# Patient Record
Sex: Female | Born: 1985 | Race: Black or African American | Hispanic: No | Marital: Married | Smoking: Never smoker
Health system: Southern US, Community
[De-identification: ages and names within clinical notes are randomized; demographics above are authoritative.]

## PROBLEM LIST (undated history)

## (undated) DIAGNOSIS — M419 Scoliosis, unspecified: Secondary | ICD-10-CM

## (undated) DIAGNOSIS — R51 Headache: Secondary | ICD-10-CM

## (undated) DIAGNOSIS — F329 Major depressive disorder, single episode, unspecified: Secondary | ICD-10-CM

## (undated) DIAGNOSIS — F32A Depression, unspecified: Secondary | ICD-10-CM

## (undated) DIAGNOSIS — R519 Headache, unspecified: Secondary | ICD-10-CM

## (undated) HISTORY — DX: Headache, unspecified: R51.9

## (undated) HISTORY — DX: Depression, unspecified: F32.A

## (undated) HISTORY — DX: Major depressive disorder, single episode, unspecified: F32.9

## (undated) HISTORY — DX: Headache: R51

## (undated) HISTORY — PX: CHOLECYSTECTOMY: SHX55

## (undated) HISTORY — PX: BACK SURGERY: SHX140

## (undated) HISTORY — DX: Scoliosis, unspecified: M41.9

## (undated) HISTORY — PX: TONSILLECTOMY: SUR1361

---

## 2018-01-09 ENCOUNTER — Ambulatory Visit: Payer: Self-pay | Admitting: Family Medicine

## 2018-01-09 VITALS — BP 126/82 | HR 92 | Temp 98.5°F | Resp 18 | Wt 292.6 lb

## 2018-01-09 DIAGNOSIS — H9203 Otalgia, bilateral: Secondary | ICD-10-CM

## 2018-01-09 NOTE — Progress Notes (Signed)
Stacey Orr is a 32 y.o. female who presents today with concerns of ear pain. She denies trauma or drainage. She reports pain is sharp when swallowing and intermittent.  Review of Systems  Constitutional: Negative for chills, fever and malaise/fatigue.  HENT: Positive for ear pain. Negative for congestion, ear discharge, sinus pain and sore throat.   Eyes: Negative.   Respiratory: Negative for cough, sputum production and shortness of breath.   Cardiovascular: Negative.  Negative for chest pain.  Gastrointestinal: Negative for abdominal pain, diarrhea, nausea and vomiting.  Genitourinary: Negative for dysuria, frequency, hematuria and urgency.  Musculoskeletal: Negative for myalgias.  Skin: Negative.   Neurological: Negative for headaches.  Endo/Heme/Allergies: Negative.   Psychiatric/Behavioral: Negative.     O: Vitals:   01/09/18 1703  BP: 126/82  Pulse: 92  Resp: 18  Temp: 98.5 F (36.9 C)  SpO2: 100%    Physical Exam  Constitutional: She is oriented to person, place, and time. Vital signs are normal. She appears well-developed and well-nourished. She is active.  Non-toxic appearance. She does not have a sickly appearance.  HENT:  Head: Normocephalic.  Right Ear: Hearing, tympanic membrane, external ear and ear canal normal.  Left Ear: Hearing, tympanic membrane, external ear and ear canal normal.  Nose: Rhinorrhea present.  Mouth/Throat: Uvula is midline and oropharynx is clear and moist.  Neck: Normal range of motion. Neck supple.  Cardiovascular: Normal rate, regular rhythm, normal heart sounds and normal pulses.  Pulmonary/Chest: Effort normal and breath sounds normal.  Abdominal: Soft. Bowel sounds are normal.  Musculoskeletal: Normal range of motion.  Lymphadenopathy:       Head (right side): No submental and no submandibular adenopathy present.       Head (left side): No submental and no submandibular adenopathy present.    She has no cervical adenopathy.   Neurological: She is alert and oriented to person, place, and time.  Psychiatric: She has a normal mood and affect.  Vitals reviewed.   A: 1. Otalgia of both ears    P: Exam findings, diagnosis etiology and medication use and indications reviewed with patient. Follow- Up and discharge instructions provided. No emergent/urgent issues found on exam.  Patient verbalized understanding of information provided and agrees with plan of care (POC), all questions answered.  1. Otalgia of both ears Discussed OTC use of Tylenol or Motrin for pain and f/u if symptoms unimproved.

## 2018-01-09 NOTE — Patient Instructions (Signed)

## 2018-01-12 ENCOUNTER — Telehealth: Payer: Self-pay

## 2018-01-12 NOTE — Telephone Encounter (Signed)
I left a message to the patient asking to call us back. 

## 2018-04-20 DIAGNOSIS — Z3201 Encounter for pregnancy test, result positive: Secondary | ICD-10-CM | POA: Diagnosis not present

## 2018-04-20 DIAGNOSIS — N912 Amenorrhea, unspecified: Secondary | ICD-10-CM | POA: Diagnosis not present

## 2018-05-07 DIAGNOSIS — Z113 Encounter for screening for infections with a predominantly sexual mode of transmission: Secondary | ICD-10-CM | POA: Diagnosis not present

## 2018-05-07 DIAGNOSIS — Z3A10 10 weeks gestation of pregnancy: Secondary | ICD-10-CM | POA: Diagnosis not present

## 2018-05-07 DIAGNOSIS — Z3A08 8 weeks gestation of pregnancy: Secondary | ICD-10-CM | POA: Diagnosis not present

## 2018-05-07 DIAGNOSIS — O26891 Other specified pregnancy related conditions, first trimester: Secondary | ICD-10-CM | POA: Diagnosis not present

## 2018-05-07 DIAGNOSIS — Z3689 Encounter for other specified antenatal screening: Secondary | ICD-10-CM | POA: Diagnosis not present

## 2018-05-07 DIAGNOSIS — Z3481 Encounter for supervision of other normal pregnancy, first trimester: Secondary | ICD-10-CM | POA: Diagnosis not present

## 2018-05-07 LAB — OB RESULTS CONSOLE RPR: RPR: NONREACTIVE

## 2018-05-07 LAB — OB RESULTS CONSOLE ABO/RH: RH Type: POSITIVE

## 2018-05-07 LAB — OB RESULTS CONSOLE RUBELLA ANTIBODY, IGM: Rubella: IMMUNE

## 2018-05-07 LAB — OB RESULTS CONSOLE HEPATITIS B SURFACE ANTIGEN: Hepatitis B Surface Ag: NEGATIVE

## 2018-05-07 LAB — OB RESULTS CONSOLE HIV ANTIBODY (ROUTINE TESTING): HIV: NONREACTIVE

## 2018-05-07 LAB — OB RESULTS CONSOLE GC/CHLAMYDIA
Chlamydia: NEGATIVE
GC PROBE AMP, GENITAL: NEGATIVE

## 2018-05-07 LAB — OB RESULTS CONSOLE ANTIBODY SCREEN: Antibody Screen: NEGATIVE

## 2018-06-03 DIAGNOSIS — O3680X0 Pregnancy with inconclusive fetal viability, not applicable or unspecified: Secondary | ICD-10-CM | POA: Diagnosis not present

## 2018-06-03 DIAGNOSIS — Z3481 Encounter for supervision of other normal pregnancy, first trimester: Secondary | ICD-10-CM | POA: Diagnosis not present

## 2018-06-03 DIAGNOSIS — Z3A12 12 weeks gestation of pregnancy: Secondary | ICD-10-CM | POA: Diagnosis not present

## 2018-07-14 MED FILL — BUTALBITAL-APAP-CAFFEINE 50: 50-300-40 | 2 days supply | Qty: 10 | Fill #0

## 2018-07-16 DIAGNOSIS — Z363 Encounter for antenatal screening for malformations: Secondary | ICD-10-CM | POA: Diagnosis not present

## 2018-07-16 DIAGNOSIS — Z3A18 18 weeks gestation of pregnancy: Secondary | ICD-10-CM | POA: Diagnosis not present

## 2018-09-09 NOTE — L&D Delivery Note (Signed)
Delivery Note Patients labor was too intense to receive an epidural.  Progressed rapidly to C/C/+2-3 and pushed for delivery,  At 2:34 AM a viable and healthy female was delivered via Vaginal, Spontaneous (Presentation:OA; LOT ).  APGAR: 8, 9; weight P .   Placenta status: delivered, intact .  Cord:3V  with the following complications: none.    Anesthesia:  none Episiotomy: None Lacerations: R labial; multiple abrasions Suture Repair: 3.0 vicryl rapide Est. Blood Loss (mL): 105cc  Mom to postpartum.  Baby to Couplet care / Skin to Skin.  Stacey Orr 12/10/2018, 3:01 AM  Br/A+/RI/Contra?/Tdap in [facility]

## 2018-09-18 DIAGNOSIS — O99213 Obesity complicating pregnancy, third trimester: Secondary | ICD-10-CM | POA: Diagnosis not present

## 2018-09-18 DIAGNOSIS — Z3689 Encounter for other specified antenatal screening: Secondary | ICD-10-CM | POA: Diagnosis not present

## 2018-09-18 DIAGNOSIS — Z23 Encounter for immunization: Secondary | ICD-10-CM | POA: Diagnosis not present

## 2018-09-18 DIAGNOSIS — Z3A28 28 weeks gestation of pregnancy: Secondary | ICD-10-CM | POA: Diagnosis not present

## 2018-10-30 DIAGNOSIS — Z3483 Encounter for supervision of other normal pregnancy, third trimester: Secondary | ICD-10-CM | POA: Diagnosis not present

## 2018-10-30 DIAGNOSIS — Z113 Encounter for screening for infections with a predominantly sexual mode of transmission: Secondary | ICD-10-CM | POA: Diagnosis not present

## 2018-10-30 DIAGNOSIS — Z3A34 34 weeks gestation of pregnancy: Secondary | ICD-10-CM | POA: Diagnosis not present

## 2018-11-16 DIAGNOSIS — Z3689 Encounter for other specified antenatal screening: Secondary | ICD-10-CM | POA: Diagnosis not present

## 2018-11-16 DIAGNOSIS — Z3A36 36 weeks gestation of pregnancy: Secondary | ICD-10-CM | POA: Diagnosis not present

## 2018-11-16 DIAGNOSIS — Z3483 Encounter for supervision of other normal pregnancy, third trimester: Secondary | ICD-10-CM | POA: Diagnosis not present

## 2018-11-16 DIAGNOSIS — Z3685 Encounter for antenatal screening for Streptococcus B: Secondary | ICD-10-CM | POA: Diagnosis not present

## 2018-11-16 LAB — OB RESULTS CONSOLE GBS: STREP GROUP B AG: NEGATIVE

## 2018-12-07 ENCOUNTER — Telehealth (HOSPITAL_COMMUNITY): Payer: Self-pay | Admitting: *Deleted

## 2018-12-07 ENCOUNTER — Encounter (HOSPITAL_COMMUNITY): Payer: Self-pay | Admitting: *Deleted

## 2018-12-07 NOTE — Telephone Encounter (Signed)
Preadmission screen  

## 2018-12-09 ENCOUNTER — Other Ambulatory Visit: Payer: Self-pay

## 2018-12-09 ENCOUNTER — Encounter (HOSPITAL_COMMUNITY): Payer: Self-pay | Admitting: *Deleted

## 2018-12-09 ENCOUNTER — Inpatient Hospital Stay (EMERGENCY_DEPARTMENT_HOSPITAL)
Admission: AD | Admit: 2018-12-09 | Discharge: 2018-12-09 | Disposition: A | Discharge status: Home / Self Care | Payer: 59 | Source: Home / Self Care | Attending: Obstetrics and Gynecology | Admitting: Obstetrics and Gynecology

## 2018-12-09 DIAGNOSIS — O471 False labor at or after 37 completed weeks of gestation: Secondary | ICD-10-CM

## 2018-12-09 DIAGNOSIS — Z3A39 39 weeks gestation of pregnancy: Secondary | ICD-10-CM

## 2018-12-09 DIAGNOSIS — O479 False labor, unspecified: Secondary | ICD-10-CM

## 2018-12-09 NOTE — Progress Notes (Signed)
Notified Darrol Poke CNM- cervical exam- unchanged, fhm strip reviewed, awaiting d/c orders.

## 2018-12-09 NOTE — Discharge Instructions (Signed)
Reasons to return to MAU:  1.  Contractions are 4-5 minutes apart or less, each last 1 minute, these have been going on for 1-2 hours, and you cannot walk or talk during them 2.  You have a large gush of fluid, or a trickle of fluid that will not stop and you have to wear a pad 3.  You have bleeding that is bright red, heavier than spotting--like menstrual bleeding (spotting can be normal in early labor or after a check of your cervix) 4.  You do not feel the baby moving like he/she normally does

## 2018-12-09 NOTE — MAU Provider Note (Signed)
S: Ms. Stacey Orr is a 33 y.o. [phone] at [redacted]w[redacted]d  who presents to MAU today for labor evaluation.     Cervical exam by RN: no cervical change  Dilation: 2 Effacement (%): 50 Cervical Position: Posterior Station: -2 Presentation: Undeterminable Exam by:: Alycia Rossetti RN  Fetal Monitoring: Baseline: 140 Variability: moderate Accelerations: present  Decelerations: none  Contractions: UI with occasional mild contractions   MDM Discussed patient with RN. NST reviewed.   A: SIUP at [redacted]w[redacted]d  False labor  P: Discharge home Labor precautions and kick counts included in AVS Patient to follow-up with [facility] as scheduled  Patient may return to MAU as needed or when in labor  Follow up on 12/12/18 for scheduled induction of no SOL prior   Herby Abraham 12/09/2018 9:38 AM

## 2018-12-09 NOTE — MAU Note (Signed)
Contractions woke her around 4 this morning. No bleeding or leaking.  Denies problems with pregnancy. Has been 2 cm.

## 2018-12-10 ENCOUNTER — Inpatient Hospital Stay (HOSPITAL_COMMUNITY)
Admission: AD | Admit: 2018-12-10 | Discharge: 2018-12-11 | DRG: 807 | Disposition: A | Discharge status: Home / Self Care | Payer: 59 | Attending: Obstetrics and Gynecology | Admitting: Obstetrics and Gynecology

## 2018-12-10 ENCOUNTER — Encounter (HOSPITAL_COMMUNITY): Payer: Self-pay | Admitting: *Deleted

## 2018-12-10 ENCOUNTER — Encounter (HOSPITAL_COMMUNITY): Payer: Self-pay | Admitting: Anesthesiology

## 2018-12-10 ENCOUNTER — Other Ambulatory Visit: Payer: Self-pay

## 2018-12-10 DIAGNOSIS — O99214 Obesity complicating childbirth: Principal | ICD-10-CM | POA: Diagnosis present

## 2018-12-10 DIAGNOSIS — M419 Scoliosis, unspecified: Secondary | ICD-10-CM | POA: Diagnosis present

## 2018-12-10 DIAGNOSIS — Z3A39 39 weeks gestation of pregnancy: Secondary | ICD-10-CM

## 2018-12-10 DIAGNOSIS — O26893 Other specified pregnancy related conditions, third trimester: Secondary | ICD-10-CM | POA: Diagnosis present

## 2018-12-10 LAB — CBC
HCT: 39.7 % (ref 36.0–46.0)
HCT: 35.6 % — ABNORMAL LOW (ref 36.0–46.0)
Hemoglobin: 12.6 g/dL (ref 12.0–15.0)
Hemoglobin: 11 g/dL — ABNORMAL LOW (ref 12.0–15.0)
MCH: 25 pg — ABNORMAL LOW (ref 26.0–34.0)
MCH: 24.3 pg — ABNORMAL LOW (ref 26.0–34.0)
MCHC: 31.7 g/dL (ref 30.0–36.0)
MCHC: 30.9 g/dL (ref 30.0–36.0)
MCV: 78.9 fL — ABNORMAL LOW (ref 80.0–100.0)
MCV: 78.8 fL — ABNORMAL LOW (ref 80.0–100.0)
Platelets: 218 10*3/uL (ref 150–400)
Platelets: 211 10*3/uL (ref 150–400)
RBC: 5.03 MIL/uL (ref 3.87–5.11)
RBC: 4.52 MIL/uL (ref 3.87–5.11)
RDW: 14.8 % (ref 11.5–15.5)
RDW: 14.7 % (ref 11.5–15.5)
WBC: 7.6 10*3/uL (ref 4.0–10.5)
WBC: 14.7 10*3/uL — ABNORMAL HIGH (ref 4.0–10.5)
nRBC: 0 % (ref 0.0–0.2)
nRBC: 0 % (ref 0.0–0.2)

## 2018-12-10 LAB — TYPE AND SCREEN
ABO/RH(D): A POS
Antibody Screen: NEGATIVE

## 2018-12-10 LAB — ABO/RH: ABO/RH(D): A POS

## 2018-12-10 MED ORDER — LACTATED RINGERS IV SOLN: 500.0000 mL | Freq: Once | INTRAVENOUS | Status: DC

## 2018-12-10 MED ORDER — DIPHENHYDRAMINE HCL 25 MG PO CAPS: 25.0000 mg | ORAL_CAPSULE | Freq: Four times a day (QID) | ORAL | Status: DC | PRN

## 2018-12-10 MED ORDER — FENTANYL-BUPIVACAINE-NACL 0.5-0.125-0.9 MG/250ML-% EP SOLN: 12.0000 mL/h | EPIDURAL | Status: DC | PRN

## 2018-12-10 MED ORDER — OXYCODONE-ACETAMINOPHEN 5-325 MG PO TABS: 1.0000 | ORAL_TABLET | ORAL | Status: DC | PRN

## 2018-12-10 MED ORDER — ONDANSETRON HCL 4 MG/2ML IJ SOLN: 4.0000 mg | INTRAMUSCULAR | Status: DC | PRN

## 2018-12-10 MED ORDER — ACETAMINOPHEN 325 MG PO TABS: 650.0000 mg | ORAL_TABLET | ORAL | Status: DC | PRN

## 2018-12-10 MED ORDER — SOD CITRATE-CITRIC ACID 500-334 MG/5ML PO SOLN: 30.0000 mL | ORAL | Status: DC | PRN

## 2018-12-10 MED ORDER — EPHEDRINE 5 MG/ML INJ: 10.0000 mg | INTRAVENOUS | Status: DC | PRN

## 2018-12-10 MED ORDER — SENNOSIDES-DOCUSATE SODIUM 8.6-50 MG PO TABS
2.0000 | ORAL_TABLET | ORAL | Status: DC
  Administered 2018-12-10: 23:00:00 2 via ORAL
  Filled 2018-12-10: qty 2

## 2018-12-10 MED ORDER — WITCH HAZEL-GLYCERIN EX PADS: 1.0000 "application " | MEDICATED_PAD | CUTANEOUS | Status: DC | PRN

## 2018-12-10 MED ORDER — ZOLPIDEM TARTRATE 5 MG PO TABS: 5.0000 mg | ORAL_TABLET | Freq: Every evening | ORAL | Status: DC | PRN

## 2018-12-10 MED ORDER — DIPHENHYDRAMINE HCL 50 MG/ML IJ SOLN: 12.5000 mg | INTRAMUSCULAR | Status: DC | PRN

## 2018-12-10 MED ORDER — PRENATAL MULTIVITAMIN CH
1.0000 | ORAL_TABLET | Freq: Every day | ORAL | Status: DC
  Administered 2018-12-10: 1 via ORAL
  Filled 2018-12-10: qty 1

## 2018-12-10 MED ORDER — FLEET ENEMA 7-19 GM/118ML RE ENEM: 1.0000 | ENEMA | RECTAL | Status: DC | PRN

## 2018-12-10 MED ORDER — OXYCODONE HCL 5 MG PO TABS: 5.0000 mg | ORAL_TABLET | ORAL | Status: DC | PRN

## 2018-12-10 MED ORDER — ONDANSETRON HCL 4 MG/2ML IJ SOLN: 4.0000 mg | Freq: Four times a day (QID) | INTRAMUSCULAR | Status: DC | PRN

## 2018-12-10 MED ORDER — FENTANYL-BUPIVACAINE-NACL 0.5-0.125-0.9 MG/250ML-% EP SOLN
12.0000 mL/h | EPIDURAL | Status: DC | PRN
  Filled 2018-12-10: qty 250

## 2018-12-10 MED ORDER — BENZOCAINE-MENTHOL 20-0.5 % EX AERO
1.0000 "application " | INHALATION_SPRAY | CUTANEOUS | Status: DC | PRN
  Filled 2018-12-10: qty 56

## 2018-12-10 MED ORDER — LIDOCAINE HCL (PF) 1 % IJ SOLN
30.0000 mL | INTRAMUSCULAR | Status: AC | PRN
  Administered 2018-12-10: 30 mL via SUBCUTANEOUS
  Filled 2018-12-10: qty 30

## 2018-12-10 MED ORDER — COCONUT OIL OIL: 1.0000 "application " | TOPICAL_OIL | Status: DC | PRN

## 2018-12-10 MED ORDER — LACTATED RINGERS IV SOLN: INTRAVENOUS | Status: DC

## 2018-12-10 MED ORDER — LACTATED RINGERS IV SOLN: 500.0000 mL | INTRAVENOUS | Status: DC | PRN

## 2018-12-10 MED ORDER — OXYTOCIN 40 UNITS IN NORMAL SALINE INFUSION - SIMPLE MED: 2.5000 [IU]/h | INTRAVENOUS | Status: DC

## 2018-12-10 MED ORDER — OXYTOCIN BOLUS FROM INFUSION: 500.0000 mL | Freq: Once | INTRAVENOUS | Status: DC

## 2018-12-10 MED ORDER — PHENYLEPHRINE 40 MCG/ML (10ML) SYRINGE FOR IV PUSH (FOR BLOOD PRESSURE SUPPORT): 80.0000 ug | PREFILLED_SYRINGE | INTRAVENOUS | Status: DC | PRN

## 2018-12-10 MED ORDER — DIBUCAINE 1 % RE OINT: 1.0000 "application " | TOPICAL_OINTMENT | RECTAL | Status: DC | PRN

## 2018-12-10 MED ORDER — LACTATED RINGERS IV SOLN
INTRAVENOUS | Status: DC
  Administered 2018-12-10: 02:00:00 via INTRAVENOUS

## 2018-12-10 MED ORDER — TETANUS-DIPHTH-ACELL PERTUSSIS 5-2.5-18.5 LF-MCG/0.5 IM SUSP: 0.5000 mL | Freq: Once | INTRAMUSCULAR | Status: DC

## 2018-12-10 MED ORDER — ONDANSETRON HCL 4 MG PO TABS: 4.0000 mg | ORAL_TABLET | ORAL | Status: DC | PRN

## 2018-12-10 MED ORDER — IBUPROFEN 600 MG PO TABS
600.0000 mg | ORAL_TABLET | Freq: Four times a day (QID) | ORAL | Status: DC
  Administered 2018-12-10 – 2018-12-11 (×5): 600 mg via ORAL
  Filled 2018-12-10 (×6): qty 1

## 2018-12-10 MED ORDER — OXYCODONE HCL 5 MG PO TABS: 10.0000 mg | ORAL_TABLET | ORAL | Status: DC | PRN

## 2018-12-10 MED ORDER — OXYCODONE-ACETAMINOPHEN 5-325 MG PO TABS: 2.0000 | ORAL_TABLET | ORAL | Status: DC | PRN

## 2018-12-10 MED ORDER — SIMETHICONE 80 MG PO CHEW: 80.0000 mg | CHEWABLE_TABLET | ORAL | Status: DC | PRN

## 2018-12-10 NOTE — Progress Notes (Signed)
Patient ID: Stacey Orr, female   DOB: 04/25/1986, 33 y.o.   MRN: [id number] PPD#0 Pt doing well with no complaints. Pain well controlled with ibuprofen. No fever/CP/SOB/HA. She is bonding well with baby. Lochia mild. Ambulating to bathroom; voiding well. Bonding well with baby VSS ABD - FF  EXT - no homans  A/P: PPD#0 s/p svd - stable         Routine pp care         Circ tomorrow

## 2018-12-10 NOTE — Lactation Note (Signed)
This note was copied from a baby's chart. Lactation Consultation Note  Patient Name: Stacey Orr MCNOB'S Date: 12/10/2018 Reason for consult: Initial assessment;Term  30 hours old FT female who is being exclusively BF by his mother, she's a P2 but not very experienced BF. She was able to BF her first child only for 2 weeks; baby had a difficult latching on. Mom participated in the [facility] program at [facility] and took BF classes there. She's familiar with hand expression, and has been able to see drops of colostrum.   Offered assistance with latch, but mom politely declined stating baby already fed. Asked mom to call for assistance when needed. LC explained to mom that baby's weight most likely will drop < 6 lbs tomorrow and asked her how does she feel about pumping. Mom voiced she's willing to pump, LC set up a DEBP instructions, cleaning and storage were reviewed. Mom doesn't have a pump at home, [facility] also showed mom how to convert her DEBP kit into a hand pump. Discussed normal newborn behavior, feeding cues and cluster feeding.  Feeding plan  1. Encouraged mom to feed baby every 3 hours or sooner if feeding cues are present 2. Mom will pump every 3 hours and feed baby any amount of EBM she may get  BF brochure and feeding diary were reviewed. Mom reported all questions and concerns were answered, she's aware of [place] [place] services and will call PRN.  Maternal Data Formula Feeding for Exclusion: No Has patient been taught Hand Expression?: Yes Does the patient have breastfeeding experience prior to this delivery?: Yes  Feeding Feeding Type: Breast Fed   Interventions Interventions: Breast feeding basics reviewed;DEBP  Lactation Tools Discussed/Used Tools: Pump Breast pump type: Double-Electric Breast Pump WIC Program: Yes Pump Review: Setup, frequency, and cleaning Initiated by:: MPeck Date initiated:: 12/10/18   Consult Status Consult Status: Follow-up Date: 12/11/18 Follow-up  type: In-patient    Sena Clouatre Francene Boyers 12/10/2018, 4:48 PM

## 2018-12-10 NOTE — H&P (Signed)
Stacey Orr is a 33 y.o. female [phone] at 39+ presentin gin advanced labor and rapidly delivered, see delivery note for details.  Pregnancy complicated by maternal obesity, maternal scoliosis w harrington rods having been placed and migraines.  Pregnancy dated by early [place]. Pt declined genetic screening.  Received Tdap in [facility].     OB History    Gravida  4   Para  1   Term  1   Preterm      AB  2   Living  1     SAB  2   TAB      Ectopic      Multiple      Live Births  1         SAB x 2 Female, SVD, 6#4 G4 present  No abn pap No STD  Past Medical History:  Diagnosis Date  . Depression    mild  . Headache   . Scoliosis   . SVD (spontaneous vaginal delivery) 12/10/2018  obesity  Past Surgical History:  Procedure Laterality Date  . BACK SURGERY    . CHOLECYSTECTOMY    . TONSILLECTOMY     Family History: ovarian cancer, DM, HTN, MI, CHF Social History:  reports that she has never smoked. She has never used smokeless tobacco. She reports that she does not drink alcohol or use drugs.  Meds PNV All NKDA, pecan allergy     Maternal Diabetes: No Genetic Screening: Declined Maternal Ultrasounds/Referrals: Normal Fetal Ultrasounds or other Referrals:  None Maternal Substance Abuse:  No Significant Maternal Medications:  None Significant Maternal Lab Results:  Lab values include: Group B Strep negative Other Comments:  None  Review of Systems  Constitutional: Negative.   HENT: Negative.   Eyes: Negative.   Respiratory: Negative.   Cardiovascular: Negative.   Gastrointestinal: Negative.   Genitourinary: Negative.   Musculoskeletal: Negative.   Skin: Negative.   Neurological: Negative.   Psychiatric/Behavioral: Negative.    Maternal Medical History:  Reason for admission: Contractions.   Contractions: Onset was 13-24 hours ago.   Frequency: regular.   Perceived severity is strong.    Fetal activity: Perceived fetal activity is normal.     Prenatal Complications - Diabetes: none.    Dilation: 10 Effacement (%): 90 Station: Plus 1 Exam by:: Bovard  Blood pressure 118/69, pulse 95, resp. rate 18, height 5\' 4"  (1.626 m), weight 133.3 kg, last menstrual period 02/26/2018, SpO2 98 %. Maternal Exam:  Uterine Assessment: Contraction frequency is regular.   Abdomen: Fundal height is difficult to appreciate secondary to obesity.   Estimated fetal weight is 6-7#.   Fetal presentation: vertex  Introitus: Normal vulva. Normal vagina.  Cervix: Cervix evaluated by digital exam.     Physical Exam  Constitutional: She is oriented to person, place, and time. She appears well-developed and well-nourished.  MORBID OBESITY  HENT:  Head: Normocephalic and atraumatic.  Cardiovascular: Normal rate and regular rhythm.  Respiratory: Effort normal and breath sounds normal. No respiratory distress. She has no wheezes.  GI: Soft. Bowel sounds are normal. She exhibits no distension. There is no abdominal tenderness.  Genitourinary:    Vulva normal.   Musculoskeletal: Normal range of motion.  Neurological: She is alert and oriented to person, place, and time.  Skin: Skin is warm and dry.  Psychiatric: She has a normal mood and affect. Her behavior is normal.    Prenatal labs: ABO, Rh: --/--/A POS (04/02 0109) Antibody: NEG (04/02 0109) Rubella: Immune (  08/29 0000) RPR: Nonreactive (08/29 0000)  HBsAg: Negative (08/29 0000)  HIV: Non-reactive (08/29 0000)  GBS: Negative (03/09 0000)   Tdap 1/10 Hgb 11.8/Plt 283/Ur Cx neg/Chl neg/GC neg/Varicella immune/Hgb electro WNL/glucola 113  [place] nl anat, post plac, female  Assessment/Plan: 33yo [id number] to [id number] s/p SVD. Routine PP care.     Mikayla Chiusano Bovard-Stuckert 12/10/2018, 3:21 AM

## 2018-12-10 NOTE — Anesthesia Preprocedure Evaluation (Deleted)
Anesthesia Evaluation  Patient identified by MRN, date of birth, ID band Patient awake    Reviewed: Allergy & Precautions, H&P , NPO status , Patient's Chart, lab work & pertinent test results  Airway Mallampati: II   Neck ROM: full    Dental   Pulmonary neg pulmonary ROS,    breath sounds clear to auscultation       Cardiovascular negative cardio ROS   Rhythm:regular Rate:Normal     Neuro/Psych  Headaches, PSYCHIATRIC DISORDERS Depression    GI/Hepatic   Endo/Other  Morbid obesity  Renal/GU      Musculoskeletal H/o scoliosis and has had back surgery   Abdominal   Peds  Hematology   Anesthesia Other Findings   Reproductive/Obstetrics (+) Pregnancy                             Anesthesia Physical Anesthesia Plan  ASA: II  Anesthesia Plan: Epidural   Post-op Pain Management:    Induction: Intravenous  PONV Risk Score and Plan: 2 and Treatment may vary due to age or medical condition  Airway Management Planned: Natural Airway  Additional Equipment:   Intra-op Plan:   Post-operative Plan:   Informed Consent: I have reviewed the patients History and Physical, chart, labs and discussed the procedure including the risks, benefits and alternatives for the proposed anesthesia with the patient or authorized representative who has indicated his/her understanding and acceptance.       Plan Discussed with: Anesthesiologist  Anesthesia Plan Comments:         Anesthesia Quick Evaluation

## 2018-12-11 LAB — RPR, QUANT+TP ABS (REFLEX)
Rapid Plasma Reagin, Quant: 1:1 {titer} — ABNORMAL HIGH
T Pallidum Abs: NONREACTIVE

## 2018-12-11 LAB — RPR: RPR Ser Ql: REACTIVE — AB

## 2018-12-11 MED ORDER — IBUPROFEN 600 MG PO TABS: 600.0000 mg | ORAL_TABLET | Freq: Four times a day (QID) | ORAL | 0 refills | Status: DC

## 2018-12-11 MED FILL — IBUPROFEN 600 MG TABLET: 600 | 8 days supply | Qty: 30 | Fill #0

## 2018-12-11 NOTE — Progress Notes (Signed)
CSW received consult for hx of Anxiety and Depression.  CSW met with MOB in room 409 to offer support and complete assessment.    When CSW arrived, MOB was resting in bed and FOB was holding/bonding with infant.  CSW explained CSW's role and MOB gave CSW permission to complete the assessment while FOB was present.   CSW asked about MOB's MH hx and MOB and FOB collectively denied that MOB has a hx of depression.  MOB also denied PPD with MOB's oldest child. MOB declined PMAD resources and reported having all essential items to care for infant.    CSW identifies no further need for intervention and no barriers to discharge at this time.  Angel Boyd-Gilyard, MSW, LCSW Clinical Social Work [phone]  

## 2018-12-11 NOTE — Progress Notes (Signed)
Post Partum Day 1 Subjective: no complaints, up ad lib and tolerating PO  Objective: Blood pressure (!) 122/58, pulse 74, temperature 97.8 F (36.6 C), temperature source Oral, resp. rate 18, height 5\' 4"  (1.626 m), weight 133.3 kg, last menstrual period 02/26/2018, SpO2 98 %, unknown if currently breastfeeding.  Physical Exam:  General: alert and cooperative Lochia: appropriate Uterine Fundus: firm   Recent Labs    12/10/18 0136 12/10/18 0537  HGB 12.6 11.0*  HCT 39.7 35.6*    Assessment/Plan: Discharge home   LOS: 1 day   Logan Bores 12/11/2018, 11:42 AM

## 2018-12-11 NOTE — Discharge Summary (Signed)
OB Discharge Summary     Patient Name: Stacey Orr DOB: 1986/02/12 MRN: [id number]  Date of admission: 12/10/2018 Delivering MD: Janyth Contes   Date of discharge: 12/11/2018  Admitting diagnosis: 62.6WKS CTX Intrauterine pregnancy: [redacted]w[redacted]d     Secondary diagnosis:  Principal Problem:   SVD (spontaneous vaginal delivery) Active Problems:   Indication for care in labor or delivery  Additional problems: none     Discharge diagnosis: Term Pregnancy Delivered                                                                                                Post partum procedures:none   Complications: None  Hospital course:  Onset of Labor With Vaginal Delivery     33 y.o. yo [id number] at [redacted]w[redacted]d was admitted in Active Labor on 12/10/2018. Patient had an uncomplicated labor course as follows:  Membrane Rupture Time/Date: 1:55 AM ,12/10/2018   Intrapartum Procedures: Episiotomy: None [1]                                         Lacerations:  1st degree [2];Perineal [11]  Patient had a delivery of a Viable infant. 12/10/2018  Information for the patient's newborn:  [place], [place] [[id number]]       Pateint had an uncomplicated postpartum course.  She is ambulating, tolerating a regular diet, passing flatus, and urinating well. Patient is discharged home in stable condition on 12/11/18.   Physical exam  Vitals:   12/10/18 0945 12/10/18 1415 12/10/18 1829 12/11/18 0634  BP: (!) 105/54 123/62 (!) 131/51 (!) 122/58  Pulse: 81 78 79 74  Resp: 18 16 18 18   Temp: 98.6 F (37 C) 98.4 F (36.9 C) 98.2 F (36.8 C) 97.8 F (36.6 C)  TempSrc: Oral Oral Oral Oral  SpO2:      Weight:      Height:       General: alert and cooperative Lochia: appropriate Uterine Fundus: firm  Labs: Lab Results  Component Value Date   WBC 14.7 (H) 12/10/2018   HGB 11.0 (L) 12/10/2018   HCT 35.6 (L) 12/10/2018   MCV 78.8 (L) 12/10/2018   PLT 211 12/10/2018   No flowsheet data found.  Discharge  instruction: per After Visit Summary and "Baby and Me Booklet".  After visit meds:  Allergies as of 12/11/2018      Reactions   Peanut-containing Drug Products Hives   Pecan Nut (diagnostic) Hives      Medication List    TAKE these medications   acetaminophen 500 MG tablet Commonly known as:  TYLENOL Take 500 mg by mouth every 6 (six) hours as needed for moderate pain.   ibuprofen 600 MG tablet Commonly known as:  ADVIL,MOTRIN Take 1 tablet (600 mg total) by mouth every 6 (six) hours.   prenatal multivitamin Tabs tablet Take 1 tablet by mouth daily at 12 noon.       Diet: routine diet  Activity: Advance as tolerated. Pelvic rest for 6 weeks.  Outpatient follow up:6 weeks Follow up Appt:No future appointments. Follow up Visit:No follow-ups on file.  Postpartum contraception: Progesterone only pills  Newborn Data: Live born female  Birth Weight: 6 lb 1.7 oz (2770 g) APGAR: 8, 9  Newborn Delivery   Birth date/time:  12/10/2018 02:34:00 Delivery type:  Vaginal, Spontaneous     Baby Feeding: Breast Disposition:home with mother   12/11/2018 Logan Bores, MD

## 2018-12-12 ENCOUNTER — Inpatient Hospital Stay (HOSPITAL_COMMUNITY): Payer: 59

## 2019-01-22 DIAGNOSIS — Z3009 Encounter for other general counseling and advice on contraception: Secondary | ICD-10-CM | POA: Diagnosis not present

## 2019-01-22 DIAGNOSIS — Z1389 Encounter for screening for other disorder: Secondary | ICD-10-CM | POA: Diagnosis not present

## 2019-01-22 DIAGNOSIS — R030 Elevated blood-pressure reading, without diagnosis of hypertension: Secondary | ICD-10-CM | POA: Diagnosis not present

## 2019-01-22 MED FILL — NORETHINDRONE 0.35 MG TAB: 0.35 | 84 days supply | Qty: 84 | Fill #0

## 2019-01-26 DIAGNOSIS — J309 Allergic rhinitis, unspecified: Secondary | ICD-10-CM | POA: Diagnosis not present

## 2019-01-26 DIAGNOSIS — I10 Essential (primary) hypertension: Secondary | ICD-10-CM | POA: Diagnosis not present

## 2019-01-26 MED FILL — HYDROCHLOROTHIAZIDE 12.5 MG: 12.5 | 30 days supply | Qty: 30 | Fill #0

## 2019-01-26 MED FILL — IPRATROPIUM 0.06% SPRAY: 0.06 | 21 days supply | Qty: 15 | Fill #0

## 2019-02-18 DIAGNOSIS — I10 Essential (primary) hypertension: Secondary | ICD-10-CM | POA: Diagnosis not present

## 2019-02-25 DIAGNOSIS — L089 Local infection of the skin and subcutaneous tissue, unspecified: Secondary | ICD-10-CM | POA: Diagnosis not present

## 2019-02-25 DIAGNOSIS — L03011 Cellulitis of right finger: Secondary | ICD-10-CM | POA: Diagnosis not present

## 2019-02-25 MED FILL — CEPHALEXIN 500 MG CAPSULE: 500 | 7 days supply | Qty: 14 | Fill #0

## 2019-03-22 DIAGNOSIS — I10 Essential (primary) hypertension: Secondary | ICD-10-CM | POA: Diagnosis not present

## 2019-03-22 DIAGNOSIS — Z09 Encounter for follow-up examination after completed treatment for conditions other than malignant neoplasm: Secondary | ICD-10-CM | POA: Diagnosis not present

## 2019-06-10 MED FILL — HYDROCHLOROTHIAZIDE 12.5 MG: 12.5 | 90 days supply | Qty: 90 | Fill #0

## 2019-12-24 DIAGNOSIS — R509 Fever, unspecified: Secondary | ICD-10-CM | POA: Diagnosis not present

## 2019-12-24 DIAGNOSIS — R52 Pain, unspecified: Secondary | ICD-10-CM | POA: Diagnosis not present

## 2019-12-24 DIAGNOSIS — I10 Essential (primary) hypertension: Secondary | ICD-10-CM | POA: Diagnosis not present

## 2019-12-24 DIAGNOSIS — U071 COVID-19: Secondary | ICD-10-CM | POA: Diagnosis not present

## 2019-12-24 DIAGNOSIS — Z79899 Other long term (current) drug therapy: Secondary | ICD-10-CM | POA: Diagnosis not present

## 2019-12-24 DIAGNOSIS — D696 Thrombocytopenia, unspecified: Secondary | ICD-10-CM | POA: Diagnosis not present

## 2019-12-24 DIAGNOSIS — N920 Excessive and frequent menstruation with regular cycle: Secondary | ICD-10-CM | POA: Diagnosis not present

## 2019-12-24 DIAGNOSIS — Z6841 Body Mass Index (BMI) 40.0 and over, adult: Secondary | ICD-10-CM | POA: Diagnosis not present

## 2019-12-24 DIAGNOSIS — R05 Cough: Secondary | ICD-10-CM | POA: Diagnosis not present

## 2019-12-25 DIAGNOSIS — R52 Pain, unspecified: Secondary | ICD-10-CM | POA: Diagnosis not present

## 2019-12-25 DIAGNOSIS — I10 Essential (primary) hypertension: Secondary | ICD-10-CM | POA: Diagnosis not present

## 2019-12-25 DIAGNOSIS — Z79899 Other long term (current) drug therapy: Secondary | ICD-10-CM | POA: Diagnosis not present

## 2019-12-25 DIAGNOSIS — U071 COVID-19: Secondary | ICD-10-CM | POA: Diagnosis not present

## 2019-12-25 DIAGNOSIS — D696 Thrombocytopenia, unspecified: Secondary | ICD-10-CM | POA: Diagnosis not present

## 2019-12-25 DIAGNOSIS — N920 Excessive and frequent menstruation with regular cycle: Secondary | ICD-10-CM | POA: Diagnosis not present

## 2019-12-25 DIAGNOSIS — R05 Cough: Secondary | ICD-10-CM | POA: Diagnosis not present

## 2019-12-25 DIAGNOSIS — R509 Fever, unspecified: Secondary | ICD-10-CM | POA: Diagnosis not present

## 2019-12-25 DIAGNOSIS — Z6841 Body Mass Index (BMI) 40.0 and over, adult: Secondary | ICD-10-CM | POA: Diagnosis not present

## 2019-12-26 DIAGNOSIS — R509 Fever, unspecified: Secondary | ICD-10-CM | POA: Diagnosis not present

## 2019-12-26 DIAGNOSIS — R05 Cough: Secondary | ICD-10-CM | POA: Diagnosis not present

## 2019-12-26 DIAGNOSIS — D696 Thrombocytopenia, unspecified: Secondary | ICD-10-CM | POA: Diagnosis not present

## 2019-12-26 DIAGNOSIS — I10 Essential (primary) hypertension: Secondary | ICD-10-CM | POA: Diagnosis not present

## 2019-12-26 DIAGNOSIS — N920 Excessive and frequent menstruation with regular cycle: Secondary | ICD-10-CM | POA: Diagnosis not present

## 2019-12-26 DIAGNOSIS — U071 COVID-19: Secondary | ICD-10-CM | POA: Diagnosis not present

## 2019-12-26 DIAGNOSIS — Z6841 Body Mass Index (BMI) 40.0 and over, adult: Secondary | ICD-10-CM | POA: Diagnosis not present

## 2019-12-26 DIAGNOSIS — Z79899 Other long term (current) drug therapy: Secondary | ICD-10-CM | POA: Diagnosis not present

## 2019-12-26 DIAGNOSIS — R52 Pain, unspecified: Secondary | ICD-10-CM | POA: Diagnosis not present

## 2019-12-27 DIAGNOSIS — R52 Pain, unspecified: Secondary | ICD-10-CM | POA: Diagnosis not present

## 2019-12-27 DIAGNOSIS — Z79899 Other long term (current) drug therapy: Secondary | ICD-10-CM | POA: Diagnosis not present

## 2019-12-27 DIAGNOSIS — Z6841 Body Mass Index (BMI) 40.0 and over, adult: Secondary | ICD-10-CM | POA: Diagnosis not present

## 2019-12-27 DIAGNOSIS — N920 Excessive and frequent menstruation with regular cycle: Secondary | ICD-10-CM | POA: Diagnosis not present

## 2019-12-27 DIAGNOSIS — R509 Fever, unspecified: Secondary | ICD-10-CM | POA: Diagnosis not present

## 2019-12-27 DIAGNOSIS — I10 Essential (primary) hypertension: Secondary | ICD-10-CM | POA: Diagnosis not present

## 2019-12-27 DIAGNOSIS — D696 Thrombocytopenia, unspecified: Secondary | ICD-10-CM | POA: Diagnosis not present

## 2019-12-27 DIAGNOSIS — R05 Cough: Secondary | ICD-10-CM | POA: Diagnosis not present

## 2019-12-27 DIAGNOSIS — U071 COVID-19: Secondary | ICD-10-CM | POA: Diagnosis not present

## 2020-01-05 DIAGNOSIS — D473 Essential (hemorrhagic) thrombocythemia: Secondary | ICD-10-CM | POA: Diagnosis not present

## 2020-01-05 DIAGNOSIS — Z09 Encounter for follow-up examination after completed treatment for conditions other than malignant neoplasm: Secondary | ICD-10-CM | POA: Diagnosis not present

## 2020-01-11 DIAGNOSIS — Z130 Encounter for screening for diseases of the blood and blood-forming organs and certain disorders involving the immune mechanism: Secondary | ICD-10-CM | POA: Diagnosis not present

## 2020-01-11 DIAGNOSIS — Z6841 Body Mass Index (BMI) 40.0 and over, adult: Secondary | ICD-10-CM | POA: Diagnosis not present

## 2020-01-11 DIAGNOSIS — Z124 Encounter for screening for malignant neoplasm of cervix: Secondary | ICD-10-CM | POA: Diagnosis not present

## 2020-01-11 DIAGNOSIS — Z01419 Encounter for gynecological examination (general) (routine) without abnormal findings: Secondary | ICD-10-CM | POA: Diagnosis not present

## 2020-01-11 DIAGNOSIS — D696 Thrombocytopenia, unspecified: Secondary | ICD-10-CM | POA: Diagnosis not present

## 2020-01-11 DIAGNOSIS — N920 Excessive and frequent menstruation with regular cycle: Secondary | ICD-10-CM | POA: Diagnosis not present

## 2020-01-11 DIAGNOSIS — Z1389 Encounter for screening for other disorder: Secondary | ICD-10-CM | POA: Diagnosis not present

## 2020-03-06 DIAGNOSIS — N920 Excessive and frequent menstruation with regular cycle: Secondary | ICD-10-CM | POA: Diagnosis not present

## 2020-03-08 ENCOUNTER — Telehealth: Payer: Self-pay | Admitting: Hematology and Oncology

## 2020-03-08 NOTE — Telephone Encounter (Signed)
Received a new hem referral from Dr. Terri Piedra at [facility] for thrombocytopenia. Pt has been scheduled to see Dr. Alvy Bimler on 7/20 at 10am. A calendar w/the appt date and time has been given to the pt.

## 2020-03-28 ENCOUNTER — Inpatient Hospital Stay: Payer: 59

## 2020-03-28 ENCOUNTER — Encounter: Payer: Self-pay | Admitting: Hematology and Oncology

## 2020-03-28 ENCOUNTER — Inpatient Hospital Stay: Payer: 59 | Attending: Hematology and Oncology | Admitting: Hematology and Oncology

## 2020-03-28 ENCOUNTER — Other Ambulatory Visit: Payer: Self-pay

## 2020-03-28 VITALS — BP 153/96 | HR 87 | Temp 98.3°F | Resp 18 | Ht 64.0 in | Wt 316.4 lb

## 2020-03-28 DIAGNOSIS — D696 Thrombocytopenia, unspecified: Secondary | ICD-10-CM | POA: Insufficient documentation

## 2020-03-28 DIAGNOSIS — N920 Excessive and frequent menstruation with regular cycle: Secondary | ICD-10-CM | POA: Diagnosis not present

## 2020-03-28 DIAGNOSIS — D509 Iron deficiency anemia, unspecified: Secondary | ICD-10-CM | POA: Insufficient documentation

## 2020-03-28 DIAGNOSIS — Z6841 Body Mass Index (BMI) 40.0 and over, adult: Secondary | ICD-10-CM | POA: Insufficient documentation

## 2020-03-28 DIAGNOSIS — D539 Nutritional anemia, unspecified: Secondary | ICD-10-CM

## 2020-03-28 DIAGNOSIS — D500 Iron deficiency anemia secondary to blood loss (chronic): Secondary | ICD-10-CM | POA: Insufficient documentation

## 2020-03-28 DIAGNOSIS — Z8616 Personal history of COVID-19: Secondary | ICD-10-CM | POA: Diagnosis not present

## 2020-03-28 LAB — CBC WITH DIFFERENTIAL/PLATELET
Abs Immature Granulocytes: 0.02 10*3/uL (ref 0.00–0.07)
Basophils Absolute: 0 10*3/uL (ref 0.0–0.1)
Basophils Relative: 1 %
Eosinophils Absolute: 0.1 10*3/uL (ref 0.0–0.5)
Eosinophils Relative: 1 %
HCT: 38 % (ref 36.0–46.0)
Hemoglobin: 11.7 g/dL — ABNORMAL LOW (ref 12.0–15.0)
Immature Granulocytes: 0 %
Lymphocytes Relative: 27 %
Lymphs Abs: 1.5 10*3/uL (ref 0.7–4.0)
MCH: 23.7 pg — ABNORMAL LOW (ref 26.0–34.0)
MCHC: 30.8 g/dL (ref 30.0–36.0)
MCV: 77.1 fL — ABNORMAL LOW (ref 80.0–100.0)
Monocytes Absolute: 0.4 10*3/uL (ref 0.1–1.0)
Monocytes Relative: 8 %
Neutro Abs: 3.6 10*3/uL (ref 1.7–7.7)
Neutrophils Relative %: 63 %
Platelets: 123 10*3/uL — ABNORMAL LOW (ref 150–400)
RBC: 4.93 MIL/uL (ref 3.87–5.11)
RDW: 14.1 % (ref 11.5–15.5)
WBC: 5.6 10*3/uL (ref 4.0–10.5)
nRBC: 0 % (ref 0.0–0.2)

## 2020-03-28 LAB — IRON AND TIBC
Iron: 50 ug/dL (ref 41–142)
Saturation Ratios: 14 % — ABNORMAL LOW (ref 21–57)
TIBC: 354 ug/dL (ref 236–444)
UIBC: 304 ug/dL (ref 120–384)

## 2020-03-28 LAB — T4, FREE: Free T4: 0.59 ng/dL — ABNORMAL LOW (ref 0.61–1.12)

## 2020-03-28 LAB — TSH: TSH: 0.993 u[IU]/mL (ref 0.308–3.960)

## 2020-03-28 LAB — VITAMIN B12: Vitamin B-12: 277 pg/mL (ref 180–914)

## 2020-03-28 LAB — FERRITIN: Ferritin: 17 ng/mL (ref 11–307)

## 2020-03-28 NOTE — Progress Notes (Signed)
[facility] NOTE  Patient Care Team: Banga, [name], DO as PCP - General (Obstetrics and Gynecology)  CHIEF COMPLAINTS/PURPOSE OF CONSULTATION:  Acute thrombocytopenia  HISTORY OF PRESENTING ILLNESS:  Stacey Orr 34 y.o. female is here because of acute thrombocytopenia.  She was found to have abnormal CBC from recent blood draw Her baseline blood count from December 10, 2018 was normal.  White blood cell count 7.6, hemoglobin 12.6 and platelet count of 218 In April, she presented to local emergency room with high-grade fever of 102 Fahrenheit She was subsequently diagnosed with COVID-19 infection and was hospitalized According to results reviewed care everywhere, her platelet count dropped to as low as 30,000 but improved 218,000 upon discharge She denies bleeding complications while hospitalized The patient have heavy menstruation.  Her date of her last menstrual period was on June 27 Since the birth of her son in April 2020, she have been having menorrhagia with regular cycle of every 30 days, typically she would bleed around 7 days She denies recent bruising/bleeding, such as spontaneous epistaxis, hematuria, melena or hematochezia  The patient denies history of liver disease, exposure to heparin, history of cardiac murmur/prior cardiovascular surgery or recent new medications She denies prior blood or platelet transfusions She does not take regular aspirin or NSAID According to scanned report, her referring physician repeated CBC on March 06, 2020 which showed white count of 4.6, hemoglobin 11.9 with MCV of 78 and platelet count of 95,000 She was then referred here for further evaluation  MEDICAL HISTORY:  Past Medical History:  Diagnosis Date  . Depression    mild  . Headache   . Scoliosis   . SVD (spontaneous vaginal delivery) 12/10/2018    SURGICAL HISTORY: Past Surgical History:  Procedure Laterality Date  . BACK SURGERY    . CHOLECYSTECTOMY     . TONSILLECTOMY      SOCIAL HISTORY: Social History   Socioeconomic History  . Marital status: Married    Spouse name: Not on file  . Number of children: 2  . Years of education: Not on file  . Highest education level: Not on file  Occupational History  . Occupation: Marketing executive  Tobacco Use  . Smoking status: Never Smoker  . Smokeless tobacco: Never Used  Vaping Use  . Vaping Use: Never used  Substance and Sexual Activity  . Alcohol use: Never  . Drug use: Never  . Sexual activity: Yes  Other Topics Concern  . Not on file  Social History Narrative  . Not on file   Social Determinants of Health   Financial Resource Strain:   . Difficulty of Paying Living Expenses:   Food Insecurity:   . Worried About Charity fundraiser in the Last Year:   . Arboriculturist in the Last Year:   Transportation Needs:   . Film/video editor (Medical):   Marland Kitchen Lack of Transportation (Non-Medical):   Physical Activity:   . Days of Exercise per Week:   . Minutes of Exercise per Session:   Stress:   . Feeling of Stress :   Social Connections:   . Frequency of Communication with Friends and Family:   . Frequency of Social Gatherings with Friends and Family:   . Attends Religious Services:   . Active Member of Clubs or Organizations:   . Attends Archivist Meetings:   Marland Kitchen Marital Status:   Intimate Partner Violence:   . Fear of Current or Ex-Partner:   .  Emotionally Abused:   Marland Kitchen Physically Abused:   . Sexually Abused:     FAMILY HISTORY: History reviewed. No pertinent family history.  ALLERGIES:  is allergic to peanut-containing drug products and pecan nut (diagnostic).  MEDICATIONS:  Current Outpatient Medications  Medication Sig Dispense Refill  . acetaminophen (TYLENOL) 500 MG tablet Take 500 mg by mouth every 6 (six) hours as needed for moderate pain.    Marland Kitchen ibuprofen (ADVIL,MOTRIN) 600 MG tablet Take 1 tablet (600 mg total) by mouth every 6 (six) hours. 30 tablet 0    No current facility-administered medications for this visit.    REVIEW OF SYSTEMS:   Constitutional: Denies fevers, chills or abnormal night sweats Eyes: Denies blurriness of vision, double vision or watery eyes Ears, nose, mouth, throat, and face: Denies mucositis or sore throat Respiratory: Denies cough, dyspnea or wheezes Cardiovascular: Denies palpitation, chest discomfort or lower extremity swelling Gastrointestinal:  Denies nausea, heartburn or change in bowel habits Skin: Denies abnormal skin rashes Lymphatics: Denies new lymphadenopathy or easy bruising Neurological:Denies numbness, tingling or new weaknesses Behavioral/Psych: Mood is stable, no new changes  All other systems were reviewed with the patient and are negative.  PHYSICAL EXAMINATION: ECOG PERFORMANCE STATUS: 0 - Asymptomatic  Vitals:   03/28/20 0925  BP: (!) 153/96  Pulse: 87  Resp: 18  Temp: 98.3 F (36.8 C)  SpO2: 100%   Filed Weights   03/28/20 0925  Weight: (!) 316 lb 6.4 oz (143.5 kg)    GENERAL:alert, no distress and comfortable SKIN: skin color, texture, turgor are normal, no rashes or significant lesions EYES: normal, conjunctiva are pink and non-injected, sclera clear OROPHARYNX:no exudate, no erythema and lips, buccal mucosa, and tongue normal  NECK: supple, thyroid normal size, non-tender, without nodularity LYMPH:  no palpable lymphadenopathy in the cervical, axillary or inguinal LUNGS: clear to auscultation and percussion with normal breathing effort HEART: regular rate & rhythm and no murmurs and no lower extremity edema ABDOMEN:abdomen soft, non-tender and normal bowel sounds Musculoskeletal:no cyanosis of digits and no clubbing  PSYCH: alert & oriented x 3 with fluent speech NEURO: no focal motor/sensory deficits  LABORATORY DATA:  I have reviewed the data as listed Lab Results  Component Value Date   WBC 5.6 03/28/2020   HGB 11.7 (L) 03/28/2020   HCT 38.0 03/28/2020   MCV  77.1 (L) 03/28/2020   PLT 123 (L) 03/28/2020   I have reviewed her peripheral blood smear which show platelet clumping with mild reduced platelet count  ASSESSMENT & PLAN Thrombocytopenia (HCC) She has acute thrombocytopenia after surviving COVID-19 infection It could be lingering autoimmune phenomenon versus consumption from recurrent heavy menstruation Her recent liver enzymes were within normal limits Review of her peripheral blood smear confirmed persistent thrombocytopenia but also platelet clumping is noted Overall, I felt that she is improving but she needs to have his CBC rechecked again in 3 months to see if she is developing chronic ITP I do not believe the thrombocytopenia is the cause of her heavy menstruation  Microcytic anemia She has microcytic anemia secondary to chronic menorrhagia Iron studies showed borderline iron deficiency I recommend she starts taking oral iron supplement I plan to recheck in the future  Menorrhagia with regular cycle TSH level is within normal limits I suspect her heavy menstruation could be related to her obesity status We have extensive discussions about weight loss strategies today   Obesity, Class III, BMI 40-49.9 (morbid obesity) ([place]) With the patient's permission, we discussed the importance  of weight loss She appears motivated We discussed some common strategies including dietary changes and exercise I am hopeful, if she is able to lose weight, her menstrual cycle will be less heavy If she is not able to lose weight, I will refer her to medical weight management center   Orders Placed This Encounter  Procedures  . CBC with Differential/Platelet    Standing Status:   Future    Number of Occurrences:   1    Standing Expiration Date:   03/28/2021  . Ferritin    Standing Status:   Future    Number of Occurrences:   1    Standing Expiration Date:   03/28/2021  . Iron and TIBC    Standing Status:   Future    Number of Occurrences:    1    Standing Expiration Date:   03/28/2021  . Vitamin B12    Standing Status:   Future    Number of Occurrences:   1    Standing Expiration Date:   03/28/2021  . TSH    Standing Status:   Future    Number of Occurrences:   1    Standing Expiration Date:   03/28/2021  . T4, free    Standing Status:   Future    Number of Occurrences:   1    Standing Expiration Date:   03/28/2021   All questions were answered. The patient knows to call the clinic with any problems, questions or concerns. No barriers to learning was detected. The total time spent in the appointment was 55 minutes encounter with patients including review of chart and various tests results, discussions about plan of care and coordination of care plan  Heath Lark, MD 03/28/2020 1:13 PM

## 2020-03-28 NOTE — Assessment & Plan Note (Signed)
With the patient's permission, we discussed the importance of weight loss She appears motivated We discussed some common strategies including dietary changes and exercise I am hopeful, if she is able to lose weight, her menstrual cycle will be less heavy If she is not able to lose weight, I will refer her to medical weight management center

## 2020-03-28 NOTE — Assessment & Plan Note (Addendum)
TSH level is within normal limits I suspect her heavy menstruation could be related to her obesity status We have extensive discussions about weight loss strategies today

## 2020-03-28 NOTE — Assessment & Plan Note (Addendum)
She has acute thrombocytopenia after surviving COVID-19 infection It could be lingering autoimmune phenomenon versus consumption from recurrent heavy menstruation Her recent liver enzymes were within normal limits Review of her peripheral blood smear confirmed persistent thrombocytopenia but also platelet clumping is noted Overall, I felt that she is improving but she needs to have his CBC rechecked again in 3 months to see if she is developing chronic ITP I do not believe the thrombocytopenia is the cause of her heavy menstruation

## 2020-03-28 NOTE — Assessment & Plan Note (Addendum)
She has microcytic anemia secondary to chronic menorrhagia Iron studies showed borderline iron deficiency I recommend she starts taking oral iron supplement I plan to recheck in the future

## 2020-04-19 DIAGNOSIS — N920 Excessive and frequent menstruation with regular cycle: Secondary | ICD-10-CM | POA: Diagnosis not present

## 2020-04-19 DIAGNOSIS — D696 Thrombocytopenia, unspecified: Secondary | ICD-10-CM | POA: Diagnosis not present

## 2020-05-01 ENCOUNTER — Other Ambulatory Visit: Payer: Self-pay | Admitting: Hematology and Oncology

## 2020-05-01 DIAGNOSIS — N920 Excessive and frequent menstruation with regular cycle: Secondary | ICD-10-CM

## 2020-05-18 ENCOUNTER — Other Ambulatory Visit: Payer: Self-pay

## 2020-05-18 ENCOUNTER — Inpatient Hospital Stay: Payer: 59 | Attending: Hematology and Oncology

## 2020-05-18 DIAGNOSIS — Z23 Encounter for immunization: Secondary | ICD-10-CM

## 2020-06-23 ENCOUNTER — Telehealth: Payer: Self-pay | Admitting: Hematology and Oncology

## 2020-06-23 ENCOUNTER — Other Ambulatory Visit: Payer: Self-pay | Admitting: Hematology and Oncology

## 2020-06-23 ENCOUNTER — Inpatient Hospital Stay: Payer: 59 | Attending: Hematology and Oncology

## 2020-06-23 ENCOUNTER — Other Ambulatory Visit: Payer: Self-pay

## 2020-06-23 DIAGNOSIS — D696 Thrombocytopenia, unspecified: Secondary | ICD-10-CM | POA: Diagnosis not present

## 2020-06-23 DIAGNOSIS — D509 Iron deficiency anemia, unspecified: Secondary | ICD-10-CM

## 2020-06-23 DIAGNOSIS — N920 Excessive and frequent menstruation with regular cycle: Secondary | ICD-10-CM

## 2020-06-23 LAB — CBC WITH DIFFERENTIAL/PLATELET
Abs Immature Granulocytes: 0.01 K/uL (ref 0.00–0.07)
Basophils Absolute: 0 K/uL (ref 0.0–0.1)
Basophils Relative: 0 %
Eosinophils Absolute: 0.1 K/uL (ref 0.0–0.5)
Eosinophils Relative: 1 %
HCT: 35.8 % — ABNORMAL LOW (ref 36.0–46.0)
Hemoglobin: 10.8 g/dL — ABNORMAL LOW (ref 12.0–15.0)
Immature Granulocytes: 0 %
Lymphocytes Relative: 34 %
Lymphs Abs: 1.7 K/uL (ref 0.7–4.0)
MCH: 23.5 pg — ABNORMAL LOW (ref 26.0–34.0)
MCHC: 30.2 g/dL (ref 30.0–36.0)
MCV: 77.8 fL — ABNORMAL LOW (ref 80.0–100.0)
Monocytes Absolute: 0.3 K/uL (ref 0.1–1.0)
Monocytes Relative: 7 %
Neutro Abs: 2.7 K/uL (ref 1.7–7.7)
Neutrophils Relative %: 58 %
Platelets: 110 K/uL — ABNORMAL LOW (ref 150–400)
RBC: 4.6 MIL/uL (ref 3.87–5.11)
RDW: 14.7 % (ref 11.5–15.5)
WBC: 4.8 K/uL (ref 4.0–10.5)
nRBC: 0 % (ref 0.0–0.2)

## 2020-06-23 LAB — IRON AND TIBC
Iron: 38 ug/dL — ABNORMAL LOW (ref 41–142)
Saturation Ratios: 12 % — ABNORMAL LOW (ref 21–57)
TIBC: 323 ug/dL (ref 236–444)
UIBC: 285 ug/dL (ref 120–384)

## 2020-06-23 LAB — SEDIMENTATION RATE: Sed Rate: 25 mm/hr — ABNORMAL HIGH (ref 0–22)

## 2020-06-23 LAB — FERRITIN: Ferritin: 27 ng/mL (ref 11–307)

## 2020-06-23 NOTE — Telephone Encounter (Signed)
I reviewed test results with the patient She has persistent iron deficiency anemia despite oral iron supplement She has no menstruation since August I recommend IV iron infusion and she is in agreement to proceed The most likely cause of her anemia is due to chronic blood loss/malabsorption syndrome. We discussed some of the risks, benefits, and alternatives of intravenous iron infusions. The patient is symptomatic from anemia and the iron level is critically low. She tolerated oral iron supplement poorly and desires to achieved higher levels of iron faster for adequate hematopoesis. Some of the side-effects to be expected including risks of infusion reactions, phlebitis, headaches, nausea and fatigue.  The patient is willing to proceed. Patient education material was dispensed.  Goal is to keep ferritin level greater than 50 and resolution of anemia   I plan to recheck blood work and iron studies approximately 6 weeks after her treatment to reassess

## 2020-06-30 ENCOUNTER — Other Ambulatory Visit: Payer: Self-pay | Admitting: Hematology and Oncology

## 2020-07-06 ENCOUNTER — Ambulatory Visit: Payer: 59

## 2020-07-07 ENCOUNTER — Inpatient Hospital Stay: Payer: 59

## 2020-07-07 ENCOUNTER — Other Ambulatory Visit: Payer: Self-pay

## 2020-07-07 VITALS — BP 150/90 | HR 85 | Temp 98.3°F | Resp 18

## 2020-07-07 DIAGNOSIS — D696 Thrombocytopenia, unspecified: Secondary | ICD-10-CM | POA: Diagnosis not present

## 2020-07-07 DIAGNOSIS — D539 Nutritional anemia, unspecified: Secondary | ICD-10-CM

## 2020-07-07 DIAGNOSIS — D509 Iron deficiency anemia, unspecified: Secondary | ICD-10-CM

## 2020-07-07 MED ORDER — SODIUM CHLORIDE 0.9 % IV SOLN
Freq: Once | INTRAVENOUS | Status: AC
  Filled 2020-07-07: qty 250

## 2020-07-07 MED ORDER — SODIUM CHLORIDE 0.9 % IV SOLN
300.0000 mg | Freq: Once | INTRAVENOUS | Status: AC
  Administered 2020-07-07: 300 mg via INTRAVENOUS
  Filled 2020-07-07: qty 10

## 2020-07-07 NOTE — Patient Instructions (Signed)

## 2020-07-13 ENCOUNTER — Ambulatory Visit: Payer: 59

## 2020-07-14 ENCOUNTER — Inpatient Hospital Stay: Payer: 59 | Attending: Hematology and Oncology

## 2020-07-14 ENCOUNTER — Other Ambulatory Visit: Payer: Self-pay

## 2020-07-14 VITALS — BP 134/85 | HR 83 | Temp 98.4°F | Resp 18

## 2020-07-14 DIAGNOSIS — D509 Iron deficiency anemia, unspecified: Secondary | ICD-10-CM

## 2020-07-14 DIAGNOSIS — D696 Thrombocytopenia, unspecified: Secondary | ICD-10-CM | POA: Diagnosis not present

## 2020-07-14 DIAGNOSIS — D539 Nutritional anemia, unspecified: Secondary | ICD-10-CM

## 2020-07-14 MED ORDER — SODIUM CHLORIDE 0.9 % IV SOLN
Freq: Once | INTRAVENOUS | Status: AC
  Filled 2020-07-14: qty 250

## 2020-07-14 MED ORDER — SODIUM CHLORIDE 0.9 % IV SOLN
300.0000 mg | Freq: Once | INTRAVENOUS | Status: AC
  Administered 2020-07-14: 300 mg via INTRAVENOUS
  Filled 2020-07-14: qty 10

## 2020-07-14 NOTE — Patient Instructions (Signed)

## 2020-08-09 ENCOUNTER — Other Ambulatory Visit (HOSPITAL_COMMUNITY): Payer: Self-pay | Admitting: Obstetrics and Gynecology

## 2020-08-09 DIAGNOSIS — N921 Excessive and frequent menstruation with irregular cycle: Secondary | ICD-10-CM | POA: Diagnosis not present

## 2020-08-09 DIAGNOSIS — N926 Irregular menstruation, unspecified: Secondary | ICD-10-CM | POA: Diagnosis not present

## 2020-08-09 MED FILL — TRANEXAMIC ACID 650 MG TABS: 650 | 5 days supply | Qty: 30 | Fill #0

## 2020-08-11 ENCOUNTER — Inpatient Hospital Stay: Payer: 59 | Attending: Hematology and Oncology

## 2020-08-11 ENCOUNTER — Other Ambulatory Visit: Payer: Self-pay

## 2020-08-11 ENCOUNTER — Inpatient Hospital Stay (HOSPITAL_BASED_OUTPATIENT_CLINIC_OR_DEPARTMENT_OTHER): Payer: 59 | Admitting: Hematology and Oncology

## 2020-08-11 ENCOUNTER — Encounter: Payer: Self-pay | Admitting: Hematology and Oncology

## 2020-08-11 ENCOUNTER — Telehealth: Payer: Self-pay | Admitting: Hematology and Oncology

## 2020-08-11 VITALS — BP 149/76 | HR 98 | Temp 98.0°F | Resp 18 | Ht 64.0 in | Wt 323.0 lb

## 2020-08-11 DIAGNOSIS — D509 Iron deficiency anemia, unspecified: Secondary | ICD-10-CM

## 2020-08-11 DIAGNOSIS — D696 Thrombocytopenia, unspecified: Secondary | ICD-10-CM | POA: Diagnosis not present

## 2020-08-11 DIAGNOSIS — N920 Excessive and frequent menstruation with regular cycle: Secondary | ICD-10-CM | POA: Diagnosis not present

## 2020-08-11 DIAGNOSIS — D693 Immune thrombocytopenic purpura: Secondary | ICD-10-CM | POA: Diagnosis not present

## 2020-08-11 LAB — FERRITIN: Ferritin: 114 ng/mL (ref 11–307)

## 2020-08-11 LAB — CBC WITH DIFFERENTIAL/PLATELET
Abs Immature Granulocytes: 0.01 10*3/uL (ref 0.00–0.07)
Basophils Absolute: 0 10*3/uL (ref 0.0–0.1)
Basophils Relative: 0 %
Eosinophils Absolute: 0 10*3/uL (ref 0.0–0.5)
Eosinophils Relative: 1 %
HCT: 38.7 % (ref 36.0–46.0)
Hemoglobin: 12 g/dL (ref 12.0–15.0)
Immature Granulocytes: 0 %
Lymphocytes Relative: 32 %
Lymphs Abs: 1.6 10*3/uL (ref 0.7–4.0)
MCH: 24.2 pg — ABNORMAL LOW (ref 26.0–34.0)
MCHC: 31 g/dL (ref 30.0–36.0)
MCV: 78 fL — ABNORMAL LOW (ref 80.0–100.0)
Monocytes Absolute: 0.3 10*3/uL (ref 0.1–1.0)
Monocytes Relative: 7 %
Neutro Abs: 2.9 10*3/uL (ref 1.7–7.7)
Neutrophils Relative %: 60 %
Platelets: 51 10*3/uL — ABNORMAL LOW (ref 150–400)
RBC: 4.96 MIL/uL (ref 3.87–5.11)
RDW: 14.8 % (ref 11.5–15.5)
WBC: 4.8 10*3/uL (ref 4.0–10.5)
nRBC: 0 % (ref 0.0–0.2)

## 2020-08-11 LAB — RETICULOCYTES
Immature Retic Fract: 3.9 % (ref 2.3–15.9)
RBC.: 4.96 MIL/uL (ref 3.87–5.11)
Retic Count, Absolute: 49.6 10*3/uL (ref 19.0–186.0)
Retic Ct Pct: 1 % (ref 0.4–3.1)

## 2020-08-11 LAB — IRON AND TIBC
Iron: 68 ug/dL (ref 41–142)
Saturation Ratios: 23 % (ref 21–57)
TIBC: 291 ug/dL (ref 236–444)
UIBC: 223 ug/dL (ref 120–384)

## 2020-08-11 NOTE — Assessment & Plan Note (Addendum)
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

## 2020-08-11 NOTE — Assessment & Plan Note (Signed)
She had recent IV iron She is no longer anemic Observe for now

## 2020-08-11 NOTE — Assessment & Plan Note (Signed)
She will continue Tranexamic acid in the meantime

## 2020-08-11 NOTE — Progress Notes (Signed)
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 CT CHEST ABDOMEN PELVIS W CONTRAST    Standing Status:   Future    Standing Expiration Date:   08/11/2021    Order Specific Question:   Preferred imaging location?    Answer:   [facility]    Order Specific Question:   Radiology Contrast Protocol - do NOT remove file path    Answer:   [email or web address]    Order Specific Question:   Is patient pregnant?    Answer:   No  . Comprehensive metabolic panel    Standing Status:   Standing    Number of Occurrences:   33    Standing Expiration Date:   08/11/2021  . ANA, IFA (with reflex)    Standing Status:   Future    Standing Expiration Date:   08/11/2021  . HIV antibody (with reflex)    Standing Status:   Future    Standing Expiration Date:   08/11/2021  . Hepatitis C antibody    Standing Status:   Future    Standing Expiration Date:   08/11/2021  . Vitamin B12    Standing Status:   Future    Standing Expiration Date:   08/11/2021  . Pregnancy, urine    Standing Status:   Future    Standing Expiration Date:   08/11/2021  . ABO/RH    Standing Status:   Future    Standing Expiration Date:   08/11/2021    The total time  spent in the appointment was 20 minutes encounter with patients including review of chart and various tests results, discussions about plan of care and coordination of care plan   All questions were answered. The patient knows to call the clinic with any problems, questions or concerns. No barriers to learning was detected.    Heath Lark, MD 12/3/202110:22 AM  INTERVAL HISTORY: Stacey Orr 34 y.o. female returns for further follow-up She has been complaining of heavy menstruation since last time I saw her In fact, she has been bleeding continuously for 6 weeks She denies easy bruising The patient denies any recent signs or symptoms of bleeding such as spontaneous epistaxis, hematuria or hematochezia.  SUMMARY OF HEMATOLOGIC HISTORY:  She was found to have abnormal CBC from recent blood draw Her baseline blood count from December 10, 2018 was normal.  White blood cell count 7.6, hemoglobin 12.6 and platelet count of 218 In April, she presented to local emergency room with high-grade fever of 102 Fahrenheit She was subsequently diagnosed with COVID-19 infection and was hospitalized According to results reviewed care everywhere, her platelet count dropped to as low as 30,000 but improved 218,000 upon discharge She denies bleeding complications while hospitalized The patient have heavy menstruation.  Her date of her last menstrual period was on June 27 Since the birth of her son in April 2020, she have been having menorrhagia with regular cycle of every 30 days, typically she would bleed around 7 days She denies recent bruising/bleeding, such as spontaneous epistaxis, hematuria, melena or hematochezia  The patient denies history of liver disease, exposure to heparin, history of cardiac murmur/prior cardiovascular surgery or recent new medications She denies prior blood or platelet transfusions She does not take regular aspirin or NSAID According to scanned report, her referring physician  repeated CBC on March 06, 2020 which showed white count of 4.6, hemoglobin 11.9 with MCV of 78 and platelet count of 95,000 She was then referred here for further evaluation She received 2 doses of IV iron in October  I have reviewed the past medical history, past surgical history, social history and family history with the patient and they are unchanged from previous note.  ALLERGIES:  is allergic to peanut-containing drug products and pecan nut (diagnostic).  MEDICATIONS:  Current Outpatient Medications  Medication Sig Dispense Refill  . tranexamic acid (LYSTEDA) 650 MG TABS tablet Take 650 mg by mouth daily.    Marland Kitchen acetaminophen (TYLENOL) 500 MG tablet Take 500 mg by mouth every 6 (six) hours as needed for moderate pain.    Marland Kitchen ibuprofen (ADVIL,MOTRIN) 600 MG tablet Take 1 tablet (600 mg total) by mouth every 6 (six) hours. 30 tablet 0   No current facility-administered medications for this visit.     REVIEW OF SYSTEMS:   Constitutional: Denies fevers, chills or night sweats Eyes: Denies blurriness of vision Ears, nose, mouth, throat, and face: Denies mucositis or sore throat Respiratory: Denies cough, dyspnea or wheezes Cardiovascular: Denies palpitation, chest discomfort or lower extremity swelling Gastrointestinal:  Denies nausea, heartburn or change in bowel habits Skin: Denies abnormal skin rashes Lymphatics: Denies new lymphadenopathy or easy bruising Neurological:Denies numbness, tingling or new weaknesses Behavioral/Psych: Mood is stable, no new changes  All other systems were reviewed with the patient and are negative.  PHYSICAL EXAMINATION: ECOG PERFORMANCE STATUS: 1 - Symptomatic but completely ambulatory  Vitals:   08/11/20 1008  BP: (!) 149/76  Pulse: 98  Resp: 18  Temp: 98 F (36.7 C)  SpO2: 100%   Filed Weights   08/11/20 1008  Weight: (!) 323 lb (146.5 kg)    GENERAL:alert, no distress and comfortable SKIN: skin color, texture, turgor are normal, no  rashes or significant lesions EYES: normal, Conjunctiva are pink and non-injected, sclera clear OROPHARYNX:no exudate, no erythema and lips, buccal mucosa, and tongue normal  NECK: supple, thyroid normal size, non-tender, without nodularity LYMPH: Palpable fullness in the supraclavicular region LUNGS: clear to auscultation and percussion with normal breathing effort HEART: regular rate & rhythm and no murmurs and no lower extremity edema ABDOMEN:abdomen soft, non-tender and normal bowel sounds Musculoskeletal:no cyanosis of digits and no clubbing  NEURO: alert & oriented x 3 with fluent speech, no focal motor/sensory deficits  LABORATORY DATA:  I have reviewed the data as listed  No results found for: NA, K, CL, CO2, GLUCOSE, BUN, CREATININE, CALCIUM, PROT, ALBUMIN, AST, ALT, ALKPHOS, BILITOT, GFRNONAA, GFRAA  No results found for: SPEP, UPEP  Lab Results  Component Value Date   WBC 4.8 08/11/2020   NEUTROABS 2.9 08/11/2020   HGB 12.0 08/11/2020   HCT 38.7 08/11/2020   MCV 78.0 (L) 08/11/2020   PLT 51 (L) 08/11/2020      Chemistry   No results found for:  NA, K, CL, CO2, BUN, CREATININE, GLU No results found for: CALCIUM, ALKPHOS, AST, ALT, BILITOT

## 2020-08-11 NOTE — Telephone Encounter (Signed)
Scheduled appointments per 12/3 sch msg. Spoke to patient who is aware of appointments dates and times.

## 2020-08-16 ENCOUNTER — Inpatient Hospital Stay: Payer: 59

## 2020-08-16 ENCOUNTER — Other Ambulatory Visit: Payer: Self-pay

## 2020-08-16 DIAGNOSIS — D696 Thrombocytopenia, unspecified: Secondary | ICD-10-CM | POA: Diagnosis not present

## 2020-08-16 DIAGNOSIS — N920 Excessive and frequent menstruation with regular cycle: Secondary | ICD-10-CM

## 2020-08-16 DIAGNOSIS — D693 Immune thrombocytopenic purpura: Secondary | ICD-10-CM

## 2020-08-16 LAB — CBC WITH DIFFERENTIAL/PLATELET
Abs Immature Granulocytes: 0.01 10*3/uL (ref 0.00–0.07)
Basophils Absolute: 0 10*3/uL (ref 0.0–0.1)
Basophils Relative: 0 %
Eosinophils Absolute: 0.1 10*3/uL (ref 0.0–0.5)
Eosinophils Relative: 1 %
HCT: 40.8 % (ref 36.0–46.0)
Hemoglobin: 12.4 g/dL (ref 12.0–15.0)
Immature Granulocytes: 0 %
Lymphocytes Relative: 34 %
Lymphs Abs: 2 10*3/uL (ref 0.7–4.0)
MCH: 23.8 pg — ABNORMAL LOW (ref 26.0–34.0)
MCHC: 30.4 g/dL (ref 30.0–36.0)
MCV: 78.3 fL — ABNORMAL LOW (ref 80.0–100.0)
Monocytes Absolute: 0.6 10*3/uL (ref 0.1–1.0)
Monocytes Relative: 10 %
Neutro Abs: 3.1 10*3/uL (ref 1.7–7.7)
Neutrophils Relative %: 55 %
Platelets: 95 10*3/uL — ABNORMAL LOW (ref 150–400)
RBC: 5.21 MIL/uL — ABNORMAL HIGH (ref 3.87–5.11)
RDW: 14.8 % (ref 11.5–15.5)
WBC: 5.7 10*3/uL (ref 4.0–10.5)
nRBC: 0 % (ref 0.0–0.2)

## 2020-08-16 LAB — COMPREHENSIVE METABOLIC PANEL
ALT: 16 U/L (ref 0–44)
AST: 20 U/L (ref 15–41)
Albumin: 3.8 g/dL (ref 3.5–5.0)
Alkaline Phosphatase: 83 U/L (ref 38–126)
Anion gap: 7 (ref 5–15)
BUN: 9 mg/dL (ref 6–20)
CO2: 24 mmol/L (ref 22–32)
Calcium: 9.1 mg/dL (ref 8.9–10.3)
Chloride: 105 mmol/L (ref 98–111)
Creatinine, Ser: 0.82 mg/dL (ref 0.44–1.00)
GFR, Estimated: >60 mL/min (ref >60)
Glucose, Bld: 80 mg/dL (ref 70–99)
Potassium: 3.7 mmol/L (ref 3.5–5.1)
Sodium: 136 mmol/L (ref 135–145)
Total Bilirubin: 0.4 mg/dL (ref 0.3–1.2)
Total Protein: 8.6 g/dL — ABNORMAL HIGH (ref 6.5–8.1)

## 2020-08-16 LAB — HEPATITIS C ANTIBODY: HCV Ab: NONREACTIVE

## 2020-08-16 LAB — ABO/RH: ABO/RH(D): A POS

## 2020-08-16 LAB — HIV ANTIBODY (ROUTINE TESTING W REFLEX): HIV Screen 4th Generation wRfx: NONREACTIVE

## 2020-08-16 LAB — VITAMIN B12: Vitamin B-12: 345 pg/mL (ref 180–914)

## 2020-08-16 LAB — PREGNANCY, URINE: Preg Test, Ur: NEGATIVE

## 2020-08-17 ENCOUNTER — Ambulatory Visit (INDEPENDENT_AMBULATORY_CARE_PROVIDER_SITE_OTHER): Payer: 59

## 2020-08-17 DIAGNOSIS — R590 Localized enlarged lymph nodes: Secondary | ICD-10-CM | POA: Diagnosis not present

## 2020-08-17 DIAGNOSIS — D693 Immune thrombocytopenic purpura: Secondary | ICD-10-CM

## 2020-08-17 DIAGNOSIS — U071 COVID-19: Secondary | ICD-10-CM | POA: Diagnosis not present

## 2020-08-17 DIAGNOSIS — D696 Thrombocytopenia, unspecified: Secondary | ICD-10-CM | POA: Diagnosis not present

## 2020-08-17 LAB — ANTINUCLEAR ANTIBODIES, IFA: ANA Ab, IFA: NEGATIVE

## 2020-08-17 MED ORDER — IOHEXOL 300 MG/ML  SOLN
100.0000 mL | Freq: Once | INTRAMUSCULAR | Status: AC | PRN
  Administered 2020-08-17: 100 mL via INTRAVENOUS

## 2020-08-18 ENCOUNTER — Encounter: Payer: Self-pay | Admitting: Hematology and Oncology

## 2020-08-18 ENCOUNTER — Inpatient Hospital Stay (HOSPITAL_BASED_OUTPATIENT_CLINIC_OR_DEPARTMENT_OTHER): Payer: 59 | Admitting: Hematology and Oncology

## 2020-08-18 DIAGNOSIS — D696 Thrombocytopenia, unspecified: Secondary | ICD-10-CM | POA: Diagnosis not present

## 2020-08-18 DIAGNOSIS — N920 Excessive and frequent menstruation with regular cycle: Secondary | ICD-10-CM

## 2020-08-18 NOTE — Assessment & Plan Note (Signed)
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

## 2020-08-18 NOTE — Assessment & Plan Note (Signed)
Her menorrhagia has stopped since she was placed on Tranexamic acid  Observe closely for now

## 2020-08-18 NOTE — Progress Notes (Signed)
HEMATOLOGY-ONCOLOGY ELECTRONIC VISIT PROGRESS NOTE  Patient Care Team: [name], DO as PCP - General (Obstetrics and Gynecology)  I connected with by [facility] video conference and verified that I am speaking with the correct person using two identifiers.  I discussed the limitations, risks, security and privacy concerns of performing an evaluation and management service by [facility] and the availability of in person appointments.  I also discussed with the patient that there may be a patient responsible charge related to this service. The patient expressed understanding and agreed to proceed.   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   INTERVAL HISTORY: Please see below for problem oriented charting. Her appointment is made to review test results It was switched to virtual visit last minute due to her recent work schedule She is doing well Since she was placed on medication with tranexamic acid, her menstruation has ceased She is doing well She has no recent signs of bleeding  SUMMARY OF HEMATOLOGIC HISTORY:  She was found to have abnormal CBC from recent blood draw Her baseline blood count from December 10, 2018 was normal.   White blood cell count 7.6, hemoglobin 12.6 and platelet count of 218 In April, she presented to local emergency room with high-grade fever of [place] She was subsequently diagnosed with COVID-19 infection and was hospitalized According to results reviewed care everywhere, her platelet count dropped to as low as 30,000 but improved 218,000 upon discharge She denies bleeding complications while hospitalized The patient have heavy menstruation.  Her date of her last menstrual period was on June 27 Since the birth of her son in April 2020, she have been having menorrhagia with regular cycle of every 30 days, typically she would bleed around 7 days She denies recent bruising/bleeding, such as spontaneous epistaxis, hematuria, melena or hematochezia  The patient denies history of liver disease, exposure to heparin, history of cardiac murmur/prior cardiovascular surgery or recent new medications She denies prior blood or platelet transfusions She does not take regular aspirin or NSAID According to scanned report, her referring physician repeated CBC on March 06, 2020 which showed white count of 4.6, hemoglobin 11.9 with MCV of 78 and platelet count of 95,000 She was then referred here for further evaluation She received 2 doses of IV iron in October She had extensive work-up with blood draw for vitamin B12 level, autoimmune testing, hepatitis C, HIV viral antibody, and CT imaging came back normal.  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, likely triggered by COVID-19 infection this year  REVIEW OF SYSTEMS:   Constitutional: Denies fevers, chills or abnormal weight loss Eyes: Denies blurriness of vision Ears, nose, mouth, throat, and face: Denies mucositis or sore throat Respiratory: Denies cough, dyspnea or wheezes  Cardiovascular: Denies palpitation, chest discomfort Gastrointestinal:  Denies nausea, heartburn or change in bowel habits Skin: Denies abnormal  skin rashes Lymphatics: Denies new lymphadenopathy or easy bruising Neurological:Denies numbness, tingling or new weaknesses Behavioral/Psych: Mood is stable, no new changes  Extremities: No lower extremity edema All other systems were reviewed with the patient and are negative.  I have reviewed the past medical history, past surgical history, social history and family history with the patient and they are unchanged from previous note.  ALLERGIES:  is allergic to peanut-containing drug products and pecan nut (diagnostic).  MEDICATIONS:  Current Outpatient Medications  Medication Sig Dispense Refill  . acetaminophen (TYLENOL) 500 MG tablet Take 500 mg by mouth every 6 (six) hours as needed for moderate pain.    . tranexamic acid (LYSTEDA) 650 MG TABS tablet Take 650 mg by mouth daily.     No current facility-administered medications for this visit.    PHYSICAL EXAMINATION: ECOG PERFORMANCE STATUS: 0 - Asymptomatic  LABORATORY DATA:  I have reviewed the data as listed CMP Latest Ref Rng & Units 08/16/2020  Glucose 70 - 99 mg/dL 80  BUN 6 - 20 mg/dL 9  Creatinine 0.44 - 1.00 mg/dL 0.82  Sodium 135 - 145 mmol/L 136  Potassium 3.5 - 5.1 mmol/L 3.7  Chloride 98 - 111 mmol/L 105  CO2 22 - 32 mmol/L 24  Calcium 8.9 - 10.3 mg/dL 9.1  Total Protein 6.5 - 8.1 g/dL 8.6(H)  Total Bilirubin 0.3 - 1.2 mg/dL 0.4  Alkaline Phos 38 - 126 U/L 83  AST 15 - 41 U/L 20  ALT 0 - 44 U/L 16    Lab Results  Component Value Date   WBC 5.7 08/16/2020   HGB 12.4 08/16/2020   HCT 40.8 08/16/2020   MCV 78.3 (L) 08/16/2020   PLT 95 (L) 08/16/2020   NEUTROABS 3.1 08/16/2020     RADIOGRAPHIC STUDIES: I have personally reviewed the radiological images as listed and agreed with the findings in the report. CT CHEST ABDOMEN PELVIS W CONTRAST  Result Date: 08/18/2020 CLINICAL DATA:  Lymphadenopathy in the neck. COVID positive early this year resulting in hospitalization for 3 days. Abnormal labs.  Thrombocytopenia. EXAM: CT CHEST, ABDOMEN, AND PELVIS WITH CONTRAST TECHNIQUE: Multidetector CT imaging of the chest, abdomen and pelvis was performed following the standard protocol during bolus administration of intravenous contrast. CONTRAST:  175mL OMNIPAQUE IOHEXOL 300 MG/ML  SOLN COMPARISON:  None. FINDINGS: CT CHEST FINDINGS Cardiovascular: Normal aortic caliber. Borderline cardiomegaly, without pericardial effusion. Mediastinum/Nodes: No supraclavicular adenopathy. Small bilateral axillary nodes which maintain their fatty hila. No mediastinal or hilar adenopathy. Minimal residual thymic tissue in the anterior mediastinum. Lungs/Pleura: No pleural fluid.  Clear lungs. Musculoskeletal: Thoracic spine fixation with minimal convex right residual thoracic spine curvature. CT ABDOMEN PELVIS FINDINGS Hepatobiliary: Mild hepatomegaly at 19.9 cm craniocaudal. No focal liver lesion. Cholecystectomy, without biliary ductal dilatation. Pancreas: Mild artifact degradation in the lower chest and upper abdomen secondary to beam hardening artifact from thoracic spine fixation hardware. Normal, without mass or ductal dilatation. Spleen: Splenic length of 6.6 cm craniocaudal. Splenic volume of 12.6 x 6.4 x 6.6 cm (volume = 280 cm^3). Adrenals/Urinary Tract: Normal adrenal glands. Normal kidneys, without hydronephrosis. Normal urinary bladder. Stomach/Bowel: Normal stomach, without wall thickening. Portions of the colon appear thick walled, favored to be due to underdistention. Example in the transverse colon on 69/2. Normal terminal ileum and appendix. Normal small bowel. Vascular/Lymphatic: Normal caliber of the aorta and branch vessels. No abdominal  adenopathy. Mildly prominent bilateral inguinal nodes are favored to be reactive and are typical in the setting of obesity. Example left inguinal node of 1.5 cm on 124/2. Reproductive: Normal uterus and adnexa. Other: No significant free fluid. Musculoskeletal: Degenerate  sclerosis of both sacroiliac joints IMPRESSION: 1. No splenomegaly or lymphadenopathy to suggest lymphoma. 2. Portions of the colon appears thick walled, favored to be due to underdistention. Correlate with any symptoms of colitis. 3. Mild limitations secondary to beam hardening artifact from thoracic spine fixation. Electronically Signed   By: Abigail Miyamoto M.D.   On: 08/18/2020 08:42    I discussed the assessment and treatment plan with the patient. The patient was provided an opportunity to ask questions and all were answered. The patient agreed with the plan and demonstrated an understanding of the instructions. The patient was advised to call back or seek an in-person evaluation if the symptoms worsen or if the condition fails to improve as anticipated.    I spent 20 minutes for the appointment reviewing test results, discuss management and coordination of care.  Heath Lark, MD 08/18/2020 2:55 PM

## 2020-08-24 ENCOUNTER — Other Ambulatory Visit: Payer: 59

## 2020-08-28 ENCOUNTER — Other Ambulatory Visit (HOSPITAL_COMMUNITY): Payer: Self-pay | Admitting: Obstetrics and Gynecology

## 2020-08-28 MED FILL — SULFAMETHOXAZOLE-TMP DS TAB: 800-160 | 3 days supply | Qty: 6 | Fill #0

## 2020-08-30 ENCOUNTER — Other Ambulatory Visit: Payer: 59

## 2020-09-04 ENCOUNTER — Inpatient Hospital Stay: Payer: 59

## 2020-09-11 ENCOUNTER — Inpatient Hospital Stay: Payer: 59 | Attending: Hematology and Oncology

## 2020-09-11 DIAGNOSIS — D693 Immune thrombocytopenic purpura: Secondary | ICD-10-CM

## 2020-09-11 DIAGNOSIS — D696 Thrombocytopenia, unspecified: Secondary | ICD-10-CM | POA: Insufficient documentation

## 2020-09-11 DIAGNOSIS — N920 Excessive and frequent menstruation with regular cycle: Secondary | ICD-10-CM

## 2020-09-11 LAB — COMPREHENSIVE METABOLIC PANEL
ALT: 17 U/L (ref 0–44)
AST: 18 U/L (ref 15–41)
Albumin: 3.6 g/dL (ref 3.5–5.0)
Alkaline Phosphatase: 74 U/L (ref 38–126)
Anion gap: 4 — ABNORMAL LOW (ref 5–15)
BUN: 10 mg/dL (ref 6–20)
CO2: 28 mmol/L (ref 22–32)
Calcium: 8.7 mg/dL — ABNORMAL LOW (ref 8.9–10.3)
Chloride: 106 mmol/L (ref 98–111)
Creatinine, Ser: 0.81 mg/dL (ref 0.44–1.00)
GFR, Estimated: >60 mL/min (ref >60)
Glucose, Bld: 114 mg/dL — ABNORMAL HIGH (ref 70–99)
Potassium: 3.8 mmol/L (ref 3.5–5.1)
Sodium: 138 mmol/L (ref 135–145)
Total Bilirubin: 0.3 mg/dL (ref 0.3–1.2)
Total Protein: 8.2 g/dL — ABNORMAL HIGH (ref 6.5–8.1)

## 2020-09-11 LAB — CBC WITH DIFFERENTIAL/PLATELET
Abs Immature Granulocytes: 0.01 10*3/uL (ref 0.00–0.07)
Basophils Absolute: 0 10*3/uL (ref 0.0–0.1)
Basophils Relative: 0 %
Eosinophils Absolute: 0.1 10*3/uL (ref 0.0–0.5)
Eosinophils Relative: 1 %
HCT: 38.1 % (ref 36.0–46.0)
Hemoglobin: 11.6 g/dL — ABNORMAL LOW (ref 12.0–15.0)
Immature Granulocytes: 0 %
Lymphocytes Relative: 34 %
Lymphs Abs: 1.5 10*3/uL (ref 0.7–4.0)
MCH: 24.1 pg — ABNORMAL LOW (ref 26.0–34.0)
MCHC: 30.4 g/dL (ref 30.0–36.0)
MCV: 79 fL — ABNORMAL LOW (ref 80.0–100.0)
Monocytes Absolute: 0.4 10*3/uL (ref 0.1–1.0)
Monocytes Relative: 8 %
Neutro Abs: 2.6 10*3/uL (ref 1.7–7.7)
Neutrophils Relative %: 57 %
Platelets: 44 10*3/uL — ABNORMAL LOW (ref 150–400)
RBC: 4.82 MIL/uL (ref 3.87–5.11)
RDW: 14.2 % (ref 11.5–15.5)
WBC: 4.5 10*3/uL (ref 4.0–10.5)
nRBC: 0 % (ref 0.0–0.2)

## 2020-09-12 ENCOUNTER — Other Ambulatory Visit: Payer: Self-pay | Admitting: Hematology and Oncology

## 2020-09-12 ENCOUNTER — Other Ambulatory Visit: Payer: 59

## 2020-09-12 ENCOUNTER — Encounter: Payer: Self-pay | Admitting: Hematology and Oncology

## 2020-09-12 ENCOUNTER — Inpatient Hospital Stay (HOSPITAL_BASED_OUTPATIENT_CLINIC_OR_DEPARTMENT_OTHER): Payer: 59 | Admitting: Hematology and Oncology

## 2020-09-12 VITALS — BP 123/66 | HR 82 | Temp 97.9°F | Resp 18 | Ht 64.0 in | Wt 326.3 lb

## 2020-09-12 DIAGNOSIS — D509 Iron deficiency anemia, unspecified: Secondary | ICD-10-CM | POA: Diagnosis not present

## 2020-09-12 DIAGNOSIS — D696 Thrombocytopenia, unspecified: Secondary | ICD-10-CM | POA: Diagnosis not present

## 2020-09-12 DIAGNOSIS — D693 Immune thrombocytopenic purpura: Secondary | ICD-10-CM | POA: Diagnosis not present

## 2020-09-12 MED ORDER — DEXAMETHASONE 4 MG PO TABS: ORAL_TABLET | ORAL | 1 refills | Status: DC

## 2020-09-12 MED FILL — DEXAMETHASONE 4 MG TABLET: 4 | 4 days supply | Qty: 20 | Fill #0

## 2020-09-12 NOTE — Assessment & Plan Note (Signed)
Unfortunately, she has persistent, recurrent ITP We discussed the risk, benefits, side effects of treatment with corticosteroid therapy Ultimately, she is in agreement to try high-dose dexamethasone, 20 mg daily for 4 days and recheck blood work next week Warn her about risk of high-dose steroid treatment including insomnia, jittery, increased appetite and weight

## 2020-09-12 NOTE — Progress Notes (Signed)
[facility] OFFICE PROGRESS NOTE  Banga, Sharol Given, DO  ASSESSMENT & PLAN:  Acute ITP ([facility]) Unfortunately, she has persistent, recurrent ITP We discussed the risk, benefits, side effects of treatment with corticosteroid therapy Ultimately, she is in agreement to try high-dose dexamethasone, 20 mg daily for 4 days and recheck blood work next week Warn her about risk of high-dose steroid treatment including insomnia, jittery, increased appetite and weight   Microcytic anemia She denies recent menorrhagia Observe closely for now   No orders of the defined types were placed in this encounter.   The total time spent in the appointment was 20 minutes encounter with patients including review of chart and various tests results, discussions about plan of care and coordination of care plan   All questions were answered. The patient knows to call the clinic with any problems, questions or concerns. No barriers to learning was detected.    Artis Delay, MD 1/4/20223:18 PM  INTERVAL HISTORY: Stacey Orr 35 y.o. female returns for urgent evaluation and discussion for recurrent, chronic ITP Since last time I saw her, she denies further menstruation since she was placed on medications to control her menstruation The patient denies any recent signs or symptoms of bleeding such as spontaneous epistaxis, hematuria or hematochezia. She had an episode of flulike illness recently and was tested negative for Covid infection Her symptoms have resolved  SUMMARY OF HEMATOLOGIC HISTORY:  She was found to have abnormal CBC from recent blood draw Her baseline blood count from December 10, 2018 was normal.  White blood cell count 7.6, hemoglobin 12.6 and platelet count of 218 In April, she presented to local emergency room with high-grade fever of 102 [place] She was subsequently diagnosed with COVID-19 infection and was hospitalized According to results reviewed care everywhere, her  platelet count dropped to as low as 30,000 but improved 218,000 upon discharge She denies bleeding complications while hospitalized The patient have heavy menstruation.  Her date of her last menstrual period was on June 27 Since the birth of her son in April 2020, she have been having menorrhagia with regular cycle of every 30 days, typically she would bleed around 7 days She denies recent bruising/bleeding, such as spontaneous epistaxis, hematuria, melena or hematochezia  The patient denies history of liver disease, exposure to heparin, history of cardiac murmur/prior cardiovascular surgery or recent new medications She denies prior blood or platelet transfusions She does not take regular aspirin or NSAID According to scanned report, her referring physician repeated CBC on March 06, 2020 which showed white count of 4.6, hemoglobin 11.9 with MCV of 78 and platelet count of 95,000 She was then referred here for further evaluation She received 2 doses of IV iron in October She had extensive work-up with blood draw for vitamin B12 level, autoimmune testing, hepatitis C, HIV viral antibody, and CT imaging came back normal.  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, likely triggered by COVID-19 infection this year  I have reviewed the past medical history, past surgical history, social history and family history with the patient and they are unchanged from previous note.  ALLERGIES:  is allergic to peanut-containing drug products and pecan nut (diagnostic).  MEDICATIONS:  Current Outpatient Medications  Medication Sig Dispense Refill  . dexamethasone (DECADRON) 4 MG tablet Take 5 tabs daily x 4 days 20 tablet 1  . acetaminophen (TYLENOL) 500 MG tablet Take 500 mg by mouth every 6 (six) hours as needed for  moderate pain.    . tranexamic acid (LYSTEDA) 650 MG TABS tablet Take 650 mg by mouth daily.     No current facility-administered medications for this visit.      REVIEW OF SYSTEMS:   Constitutional: Denies fevers, chills or night sweats Eyes: Denies blurriness of vision Ears, nose, mouth, throat, and face: Denies mucositis or sore throat Respiratory: Denies cough, dyspnea or wheezes Cardiovascular: Denies palpitation, chest discomfort or lower extremity swelling Gastrointestinal:  Denies nausea, heartburn or change in bowel habits Skin: Denies abnormal skin rashes Lymphatics: Denies new lymphadenopathy or easy bruising Neurological:Denies numbness, tingling or new weaknesses Behavioral/Psych: Mood is stable, no new changes  All other systems were reviewed with the patient and are negative.  PHYSICAL EXAMINATION: ECOG PERFORMANCE STATUS: 0 - Asymptomatic  Vitals:   09/12/20 1034  BP: 123/66  Pulse: 82  Resp: 18  Temp: 97.9 F (36.6 C)  SpO2: 100%   Filed Weights   09/12/20 1034  Weight: (!) 326 lb 4.8 oz (148 kg)    GENERAL:alert, no distress and comfortable NEURO: alert & oriented x 3 with fluent speech, no focal motor/sensory deficits  LABORATORY DATA:  I have reviewed the data as listed     Component Value Date/Time   NA 138 09/11/2020 0924   K 3.8 09/11/2020 0924   CL 106 09/11/2020 0924   CO2 28 09/11/2020 0924   GLUCOSE 114 (H) 09/11/2020 0924   BUN 10 09/11/2020 0924   CREATININE 0.81 09/11/2020 0924   CALCIUM 8.7 (L) 09/11/2020 0924   PROT 8.2 (H) 09/11/2020 0924   ALBUMIN 3.6 09/11/2020 0924   AST 18 09/11/2020 0924   ALT 17 09/11/2020 0924   ALKPHOS 74 09/11/2020 0924   BILITOT 0.3 09/11/2020 0924   GFRNONAA >60 09/11/2020 0924    No results found for: SPEP, UPEP  Lab Results  Component Value Date   WBC 4.5 09/11/2020   NEUTROABS 2.6 09/11/2020   HGB 11.6 (L) 09/11/2020   HCT 38.1 09/11/2020   MCV 79.0 (L) 09/11/2020   PLT 44 (L) 09/11/2020      Chemistry      Component Value Date/Time   NA 138 09/11/2020 0924   K 3.8 09/11/2020 0924   CL 106 09/11/2020 0924   CO2 28 09/11/2020 0924   BUN  10 09/11/2020 0924   CREATININE 0.81 09/11/2020 0924      Component Value Date/Time   CALCIUM 8.7 (L) 09/11/2020 0924   ALKPHOS 74 09/11/2020 0924   AST 18 09/11/2020 0924   ALT 17 09/11/2020 0924   BILITOT 0.3 09/11/2020 [id number]

## 2020-09-12 NOTE — Assessment & Plan Note (Signed)
She denies recent menorrhagia Observe closely for now

## 2020-09-18 ENCOUNTER — Encounter: Payer: Self-pay | Admitting: Hematology and Oncology

## 2020-09-18 ENCOUNTER — Other Ambulatory Visit: Payer: Self-pay

## 2020-09-18 ENCOUNTER — Inpatient Hospital Stay (HOSPITAL_BASED_OUTPATIENT_CLINIC_OR_DEPARTMENT_OTHER): Payer: 59 | Admitting: Hematology and Oncology

## 2020-09-18 ENCOUNTER — Inpatient Hospital Stay: Payer: 59

## 2020-09-18 DIAGNOSIS — D72825 Bandemia: Secondary | ICD-10-CM | POA: Diagnosis not present

## 2020-09-18 DIAGNOSIS — D693 Immune thrombocytopenic purpura: Secondary | ICD-10-CM

## 2020-09-18 DIAGNOSIS — D696 Thrombocytopenia, unspecified: Secondary | ICD-10-CM

## 2020-09-18 DIAGNOSIS — D72829 Elevated white blood cell count, unspecified: Secondary | ICD-10-CM | POA: Insufficient documentation

## 2020-09-18 DIAGNOSIS — D509 Iron deficiency anemia, unspecified: Secondary | ICD-10-CM

## 2020-09-18 DIAGNOSIS — N920 Excessive and frequent menstruation with regular cycle: Secondary | ICD-10-CM

## 2020-09-18 LAB — COMPREHENSIVE METABOLIC PANEL
ALT: 14 U/L (ref 0–44)
AST: 9 U/L — ABNORMAL LOW (ref 15–41)
Albumin: 3.4 g/dL — ABNORMAL LOW (ref 3.5–5.0)
Alkaline Phosphatase: 57 U/L (ref 38–126)
Anion gap: 10 (ref 5–15)
BUN: 17 mg/dL (ref 6–20)
CO2: 23 mmol/L (ref 22–32)
Calcium: 7.9 mg/dL — ABNORMAL LOW (ref 8.9–10.3)
Chloride: 105 mmol/L (ref 98–111)
Creatinine, Ser: 0.88 mg/dL (ref 0.44–1.00)
GFR, Estimated: >60 mL/min (ref >60)
Glucose, Bld: 111 mg/dL — ABNORMAL HIGH (ref 70–99)
Potassium: 3.5 mmol/L (ref 3.5–5.1)
Sodium: 138 mmol/L (ref 135–145)
Total Bilirubin: 0.4 mg/dL (ref 0.3–1.2)
Total Protein: 7.6 g/dL (ref 6.5–8.1)

## 2020-09-18 LAB — CBC WITH DIFFERENTIAL/PLATELET
Abs Immature Granulocytes: 0.13 K/uL — ABNORMAL HIGH (ref 0.00–0.07)
Basophils Absolute: 0 K/uL (ref 0.0–0.1)
Basophils Relative: 0 %
Eosinophils Absolute: 0 K/uL (ref 0.0–0.5)
Eosinophils Relative: 0 %
HCT: 39.9 % (ref 36.0–46.0)
Hemoglobin: 12.4 g/dL (ref 12.0–15.0)
Immature Granulocytes: 1 %
Lymphocytes Relative: 42 %
Lymphs Abs: 4.9 K/uL — ABNORMAL HIGH (ref 0.7–4.0)
MCH: 24 pg — ABNORMAL LOW (ref 26.0–34.0)
MCHC: 31.1 g/dL (ref 30.0–36.0)
MCV: 77.2 fL — ABNORMAL LOW (ref 80.0–100.0)
Monocytes Absolute: 0.6 K/uL (ref 0.1–1.0)
Monocytes Relative: 5 %
Neutro Abs: 6 K/uL (ref 1.7–7.7)
Neutrophils Relative %: 52 %
Platelets: 217 K/uL (ref 150–400)
RBC: 5.17 MIL/uL — ABNORMAL HIGH (ref 3.87–5.11)
RDW: 14.3 % (ref 11.5–15.5)
WBC: 11.7 K/uL — ABNORMAL HIGH (ref 4.0–10.5)
nRBC: 0 % (ref 0.0–0.2)

## 2020-09-18 NOTE — Assessment & Plan Note (Signed)
She has complete resolution of thrombocytopenia with high-dose pulse steroids for 4 days I recommend close monitoring of platelet count weekly for the next few weeks She is instructed to call me sooner if she experience any signs or symptoms of bleeding or easy bruising

## 2020-09-18 NOTE — Assessment & Plan Note (Signed)
This has resolved with recent high-dose steroids Observe closely

## 2020-09-18 NOTE — Progress Notes (Signed)
[facility] OFFICE PROGRESS NOTE  Banga, [name], DO  ASSESSMENT & PLAN:  Acute ITP ([place]) She has complete resolution of thrombocytopenia with high-dose pulse steroids for 4 days I recommend close monitoring of platelet count weekly for the next few weeks She is instructed to call me sooner if she experience any signs or symptoms of bleeding or easy bruising  Microcytic anemia This has resolved with recent high-dose steroids Observe closely  Leukocytosis This is likely due to recent steroid treatment Observe closely for now   No orders of the defined types were placed in this encounter.   The total time spent in the appointment was 20 minutes encounter with patients including review of chart and various tests results, discussions about plan of care and coordination of care plan   All questions were answered. The patient knows to call the clinic with any problems, questions or concerns. No barriers to learning was detected.    Heath Lark, MD 1/10/20229:57 AM  INTERVAL HISTORY: Stacey Orr 35 y.o. female returns for further follow-up on recurrent acute ITP She is doing well The steroids caused her to feel jittery and she has gained some weight but overall, she tolerated that well The patient denies any recent signs or symptoms of bleeding such as spontaneous epistaxis, hematuria or hematochezia.  SUMMARY OF HEMATOLOGIC HISTORY:  She was found to have abnormal CBC from recent blood draw Her baseline blood count from December 10, 2018 was normal.  White blood cell count 7.6, hemoglobin 12.6 and platelet count of 218 In April, she presented to local emergency room with high-grade fever of 102 [place] She was subsequently diagnosed with COVID-19 infection and was hospitalized According to results reviewed care everywhere, her platelet count dropped to as low as 30,000 but improved 218,000 upon discharge She denies bleeding complications while  hospitalized The patient have heavy menstruation.  Her date of her last menstrual period was on June 27 Since the birth of her son in April 2020, she have been having menorrhagia with regular cycle of every 30 days, typically she would bleed around 7 days She denies recent bruising/bleeding, such as spontaneous epistaxis, hematuria, melena or hematochezia  The patient denies history of liver disease, exposure to heparin, history of cardiac murmur/prior cardiovascular surgery or recent new medications She denies prior blood or platelet transfusions She does not take regular aspirin or NSAID According to scanned report, her referring physician repeated CBC on March 06, 2020 which showed white count of 4.6, hemoglobin 11.9 with MCV of 78 and platelet count of 95,000 She was then referred here for further evaluation She received 2 doses of IV iron in October She had extensive work-up with blood draw for vitamin B12 level, autoimmune testing, hepatitis C, HIV viral antibody, and CT imaging came back normal.  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, likely triggered by COVID-19 infection this year  I have reviewed the past medical history, past surgical history, social history and family history with the patient and they are unchanged from previous note.  ALLERGIES:  is allergic to peanut-containing drug products and pecan nut (diagnostic).  MEDICATIONS:  Current Outpatient Medications  Medication Sig Dispense Refill  . acetaminophen (TYLENOL) 500 MG tablet Take 500 mg by mouth every 6 (six) hours as needed for moderate pain.    . tranexamic acid (LYSTEDA) 650 MG TABS tablet Take 650 mg by mouth daily.     No current facility-administered medications for this visit.  REVIEW OF SYSTEMS:   Constitutional: Denies fevers, chills or night sweats Eyes: Denies blurriness of vision Ears, nose, mouth, throat, and face: Denies mucositis or sore throat Respiratory:  Denies cough, dyspnea or wheezes Cardiovascular: Denies palpitation, chest discomfort or lower extremity swelling Gastrointestinal:  Denies nausea, heartburn or change in bowel habits Skin: Denies abnormal skin rashes Lymphatics: Denies new lymphadenopathy or easy bruising Neurological:Denies numbness, tingling or new weaknesses Behavioral/Psych: Mood is stable, no new changes  All other systems were reviewed with the patient and are negative.  PHYSICAL EXAMINATION: ECOG PERFORMANCE STATUS: 0 - Asymptomatic  Vitals:   09/18/20 0840  BP: (!) 154/96  Pulse: 69  Resp: 18  Temp: 98 F (36.7 C)  SpO2: 100%   Filed Weights   09/18/20 0840  Weight: (!) 329 lb (149.2 kg)    GENERAL:alert, no distress and comfortable NEURO: alert & oriented x 3 with fluent speech, no focal motor/sensory deficits  LABORATORY DATA:  I have reviewed the data as listed     Component Value Date/Time   NA 138 09/18/2020 0820   K 3.5 09/18/2020 0820   CL 105 09/18/2020 0820   CO2 23 09/18/2020 0820   GLUCOSE 111 (H) 09/18/2020 0820   BUN 17 09/18/2020 0820   CREATININE 0.88 09/18/2020 0820   CALCIUM 7.9 (L) 09/18/2020 0820   PROT 7.6 09/18/2020 0820   ALBUMIN 3.4 (L) 09/18/2020 0820   AST 9 (L) 09/18/2020 0820   ALT 14 09/18/2020 0820   ALKPHOS 57 09/18/2020 0820   BILITOT 0.4 09/18/2020 0820   GFRNONAA >60 09/18/2020 0820    No results found for: SPEP, UPEP  Lab Results  Component Value Date   WBC 11.7 (H) 09/18/2020   NEUTROABS 6.0 09/18/2020   HGB 12.4 09/18/2020   HCT 39.9 09/18/2020   MCV 77.2 (L) 09/18/2020   PLT 217 09/18/2020      Chemistry      Component Value Date/Time   NA 138 09/18/2020 0820   K 3.5 09/18/2020 0820   CL 105 09/18/2020 0820   CO2 23 09/18/2020 0820   BUN 17 09/18/2020 0820   CREATININE 0.88 09/18/2020 0820      Component Value Date/Time   CALCIUM 7.9 (L) 09/18/2020 0820   ALKPHOS 57 09/18/2020 0820   AST 9 (L) 09/18/2020 0820   ALT 14 09/18/2020  0820   BILITOT 0.4 09/18/2020 0820

## 2020-09-18 NOTE — Assessment & Plan Note (Signed)
This is likely due to recent steroid treatment Observe closely for now

## 2020-09-25 ENCOUNTER — Inpatient Hospital Stay: Payer: 59

## 2020-09-29 DIAGNOSIS — R0982 Postnasal drip: Secondary | ICD-10-CM | POA: Diagnosis not present

## 2020-09-29 DIAGNOSIS — R059 Cough, unspecified: Secondary | ICD-10-CM | POA: Diagnosis not present

## 2020-09-29 DIAGNOSIS — J029 Acute pharyngitis, unspecified: Secondary | ICD-10-CM | POA: Diagnosis not present

## 2020-09-29 DIAGNOSIS — Z20822 Contact with and (suspected) exposure to covid-19: Secondary | ICD-10-CM | POA: Diagnosis not present

## 2020-10-02 ENCOUNTER — Other Ambulatory Visit: Payer: Self-pay

## 2020-10-02 ENCOUNTER — Inpatient Hospital Stay: Payer: 59

## 2020-10-02 DIAGNOSIS — N920 Excessive and frequent menstruation with regular cycle: Secondary | ICD-10-CM

## 2020-10-02 DIAGNOSIS — D696 Thrombocytopenia, unspecified: Secondary | ICD-10-CM | POA: Diagnosis not present

## 2020-10-02 LAB — CBC WITH DIFFERENTIAL/PLATELET
Abs Immature Granulocytes: 0.01 10*3/uL (ref 0.00–0.07)
Basophils Absolute: 0 10*3/uL (ref 0.0–0.1)
Basophils Relative: 0 %
Eosinophils Absolute: 0.1 10*3/uL (ref 0.0–0.5)
Eosinophils Relative: 1 %
HCT: 39.4 % (ref 36.0–46.0)
Hemoglobin: 12.1 g/dL (ref 12.0–15.0)
Immature Granulocytes: 0 %
Lymphocytes Relative: 22 %
Lymphs Abs: 1.5 10*3/uL (ref 0.7–4.0)
MCH: 24.4 pg — ABNORMAL LOW (ref 26.0–34.0)
MCHC: 30.7 g/dL (ref 30.0–36.0)
MCV: 79.4 fL — ABNORMAL LOW (ref 80.0–100.0)
Monocytes Absolute: 0.5 10*3/uL (ref 0.1–1.0)
Monocytes Relative: 7 %
Neutro Abs: 4.7 10*3/uL (ref 1.7–7.7)
Neutrophils Relative %: 70 %
Platelets: 165 10*3/uL (ref 150–400)
RBC: 4.96 MIL/uL (ref 3.87–5.11)
RDW: 14.5 % (ref 11.5–15.5)
WBC: 6.7 10*3/uL (ref 4.0–10.5)
nRBC: 0 % (ref 0.0–0.2)

## 2020-10-05 ENCOUNTER — Other Ambulatory Visit (HOSPITAL_COMMUNITY): Payer: Self-pay | Admitting: Obstetrics and Gynecology

## 2020-10-05 MED FILL — SULFAMETHOXAZOLE-TMP DS TAB: 800-160 | 3 days supply | Qty: 6 | Fill #0

## 2020-10-09 ENCOUNTER — Inpatient Hospital Stay: Payer: 59

## 2020-10-09 ENCOUNTER — Other Ambulatory Visit: Payer: Self-pay

## 2020-10-09 DIAGNOSIS — D696 Thrombocytopenia, unspecified: Secondary | ICD-10-CM | POA: Diagnosis not present

## 2020-10-09 DIAGNOSIS — N920 Excessive and frequent menstruation with regular cycle: Secondary | ICD-10-CM

## 2020-10-09 LAB — CBC WITH DIFFERENTIAL/PLATELET
Abs Immature Granulocytes: 0.01 10*3/uL (ref 0.00–0.07)
Basophils Absolute: 0 10*3/uL (ref 0.0–0.1)
Basophils Relative: 0 %
Eosinophils Absolute: 0.1 10*3/uL (ref 0.0–0.5)
Eosinophils Relative: 1 %
HCT: 40 % (ref 36.0–46.0)
Hemoglobin: 12.1 g/dL (ref 12.0–15.0)
Immature Granulocytes: 0 %
Lymphocytes Relative: 36 %
Lymphs Abs: 1.5 10*3/uL (ref 0.7–4.0)
MCH: 24.1 pg — ABNORMAL LOW (ref 26.0–34.0)
MCHC: 30.3 g/dL (ref 30.0–36.0)
MCV: 79.7 fL — ABNORMAL LOW (ref 80.0–100.0)
Monocytes Absolute: 0.4 10*3/uL (ref 0.1–1.0)
Monocytes Relative: 9 %
Neutro Abs: 2.3 10*3/uL (ref 1.7–7.7)
Neutrophils Relative %: 54 %
Platelets: 212 10*3/uL (ref 150–400)
RBC: 5.02 MIL/uL (ref 3.87–5.11)
RDW: 14.5 % (ref 11.5–15.5)
WBC: 4.3 10*3/uL (ref 4.0–10.5)
nRBC: 0 % (ref 0.0–0.2)

## 2020-10-16 ENCOUNTER — Other Ambulatory Visit: Payer: 59

## 2020-10-18 DIAGNOSIS — U071 COVID-19: Secondary | ICD-10-CM | POA: Diagnosis not present

## 2020-10-19 ENCOUNTER — Other Ambulatory Visit: Payer: Self-pay | Admitting: Hematology and Oncology

## 2020-10-19 DIAGNOSIS — D693 Immune thrombocytopenic purpura: Secondary | ICD-10-CM

## 2020-10-19 MED ORDER — PREDNISONE 20 MG PO TABS: 60.0000 mg | ORAL_TABLET | Freq: Every day | ORAL | 0 refills | Status: DC

## 2020-10-26 ENCOUNTER — Inpatient Hospital Stay: Payer: 59

## 2020-10-26 ENCOUNTER — Inpatient Hospital Stay: Payer: 59 | Attending: Hematology and Oncology

## 2020-10-26 ENCOUNTER — Other Ambulatory Visit: Payer: Self-pay

## 2020-10-26 DIAGNOSIS — D696 Thrombocytopenia, unspecified: Secondary | ICD-10-CM | POA: Insufficient documentation

## 2020-10-26 DIAGNOSIS — N920 Excessive and frequent menstruation with regular cycle: Secondary | ICD-10-CM

## 2020-10-26 LAB — CBC WITH DIFFERENTIAL/PLATELET
Abs Immature Granulocytes: 0.08 10*3/uL — ABNORMAL HIGH (ref 0.00–0.07)
Basophils Absolute: 0.1 10*3/uL (ref 0.0–0.1)
Basophils Relative: 0 %
Eosinophils Absolute: 0 10*3/uL (ref 0.0–0.5)
Eosinophils Relative: 0 %
HCT: 39.6 % (ref 36.0–46.0)
Hemoglobin: 12.3 g/dL (ref 12.0–15.0)
Immature Granulocytes: 1 %
Lymphocytes Relative: 33 %
Lymphs Abs: 4.4 10*3/uL — ABNORMAL HIGH (ref 0.7–4.0)
MCH: 24.2 pg — ABNORMAL LOW (ref 26.0–34.0)
MCHC: 31.1 g/dL (ref 30.0–36.0)
MCV: 77.8 fL — ABNORMAL LOW (ref 80.0–100.0)
Monocytes Absolute: 0.9 10*3/uL (ref 0.1–1.0)
Monocytes Relative: 7 %
Neutro Abs: 7.9 10*3/uL — ABNORMAL HIGH (ref 1.7–7.7)
Neutrophils Relative %: 59 %
Platelets: 286 10*3/uL (ref 150–400)
RBC: 5.09 MIL/uL (ref 3.87–5.11)
RDW: 14.4 % (ref 11.5–15.5)
WBC: 13.3 10*3/uL — ABNORMAL HIGH (ref 4.0–10.5)
nRBC: 0 % (ref 0.0–0.2)

## 2020-11-02 ENCOUNTER — Inpatient Hospital Stay: Payer: 59

## 2020-11-06 ENCOUNTER — Other Ambulatory Visit: Payer: 59

## 2020-11-06 ENCOUNTER — Other Ambulatory Visit: Payer: Self-pay

## 2020-11-06 ENCOUNTER — Inpatient Hospital Stay: Payer: 59

## 2020-11-06 DIAGNOSIS — D696 Thrombocytopenia, unspecified: Secondary | ICD-10-CM | POA: Diagnosis not present

## 2020-11-06 DIAGNOSIS — N920 Excessive and frequent menstruation with regular cycle: Secondary | ICD-10-CM

## 2020-11-06 LAB — CBC WITH DIFFERENTIAL/PLATELET
Abs Immature Granulocytes: 0.02 10*3/uL (ref 0.00–0.07)
Basophils Absolute: 0 10*3/uL (ref 0.0–0.1)
Basophils Relative: 1 %
Eosinophils Absolute: 0 10*3/uL (ref 0.0–0.5)
Eosinophils Relative: 1 %
HCT: 41.9 % (ref 36.0–46.0)
Hemoglobin: 12.9 g/dL (ref 12.0–15.0)
Immature Granulocytes: 0 %
Lymphocytes Relative: 29 %
Lymphs Abs: 1.8 10*3/uL (ref 0.7–4.0)
MCH: 24.4 pg — ABNORMAL LOW (ref 26.0–34.0)
MCHC: 30.8 g/dL (ref 30.0–36.0)
MCV: 79.4 fL — ABNORMAL LOW (ref 80.0–100.0)
Monocytes Absolute: 0.7 10*3/uL (ref 0.1–1.0)
Monocytes Relative: 11 %
Neutro Abs: 3.7 10*3/uL (ref 1.7–7.7)
Neutrophils Relative %: 58 %
Platelets: 189 10*3/uL (ref 150–400)
RBC: 5.28 MIL/uL — ABNORMAL HIGH (ref 3.87–5.11)
RDW: 14.9 % (ref 11.5–15.5)
WBC: 6.4 10*3/uL (ref 4.0–10.5)
nRBC: 0 % (ref 0.0–0.2)

## 2020-11-11 ENCOUNTER — Other Ambulatory Visit: Payer: Self-pay | Admitting: Hematology and Oncology

## 2021-02-09 DIAGNOSIS — Z0000 Encounter for general adult medical examination without abnormal findings: Secondary | ICD-10-CM | POA: Diagnosis not present

## 2021-02-16 ENCOUNTER — Inpatient Hospital Stay (HOSPITAL_BASED_OUTPATIENT_CLINIC_OR_DEPARTMENT_OTHER): Payer: 59 | Admitting: Hematology and Oncology

## 2021-02-16 ENCOUNTER — Encounter: Payer: Self-pay | Admitting: Hematology and Oncology

## 2021-02-16 ENCOUNTER — Other Ambulatory Visit (HOSPITAL_COMMUNITY): Payer: Self-pay

## 2021-02-16 ENCOUNTER — Inpatient Hospital Stay: Payer: 59 | Attending: Hematology and Oncology

## 2021-02-16 ENCOUNTER — Other Ambulatory Visit: Payer: Self-pay

## 2021-02-16 VITALS — BP 151/89 | HR 91 | Temp 97.7°F | Resp 18 | Ht 64.0 in | Wt 327.1 lb

## 2021-02-16 DIAGNOSIS — D693 Immune thrombocytopenic purpura: Secondary | ICD-10-CM | POA: Insufficient documentation

## 2021-02-16 DIAGNOSIS — D509 Iron deficiency anemia, unspecified: Secondary | ICD-10-CM | POA: Diagnosis not present

## 2021-02-16 DIAGNOSIS — N920 Excessive and frequent menstruation with regular cycle: Secondary | ICD-10-CM

## 2021-02-16 DIAGNOSIS — Z79899 Other long term (current) drug therapy: Secondary | ICD-10-CM | POA: Diagnosis not present

## 2021-02-16 LAB — CBC WITH DIFFERENTIAL/PLATELET
Abs Immature Granulocytes: 0.01 10*3/uL (ref 0.00–0.07)
Basophils Absolute: 0 10*3/uL (ref 0.0–0.1)
Basophils Relative: 0 %
Eosinophils Absolute: 0.1 10*3/uL (ref 0.0–0.5)
Eosinophils Relative: 1 %
HCT: 37.4 % (ref 36.0–46.0)
Hemoglobin: 11.9 g/dL — ABNORMAL LOW (ref 12.0–15.0)
Immature Granulocytes: 0 %
Lymphocytes Relative: 31 %
Lymphs Abs: 1.6 10*3/uL (ref 0.7–4.0)
MCH: 24.7 pg — ABNORMAL LOW (ref 26.0–34.0)
MCHC: 31.8 g/dL (ref 30.0–36.0)
MCV: 77.8 fL — ABNORMAL LOW (ref 80.0–100.0)
Monocytes Absolute: 0.6 10*3/uL (ref 0.1–1.0)
Monocytes Relative: 11 %
Neutro Abs: 3 10*3/uL (ref 1.7–7.7)
Neutrophils Relative %: 57 %
Platelets: 46 10*3/uL — ABNORMAL LOW (ref 150–400)
RBC: 4.81 MIL/uL (ref 3.87–5.11)
RDW: 14 % (ref 11.5–15.5)
WBC: 5.3 10*3/uL (ref 4.0–10.5)
nRBC: 0 % (ref 0.0–0.2)

## 2021-02-16 MED ORDER — LIDOCAINE-PRILOCAINE 2.5-2.5 % EX CREA
TOPICAL_CREAM | CUTANEOUS | 3 refills | Status: DC
  Filled 2021-02-16: qty 30, 10d supply, fill #0

## 2021-02-16 NOTE — Assessment & Plan Note (Signed)
She has signs of microcytic anemia I am concerned about recurrent iron deficiency I plan to recheck iron studies again in her next visit and replace as needed

## 2021-02-16 NOTE — Progress Notes (Signed)
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, she has recurrent ITP again She is not symptomatic We have extensive discussions about treatment options We discussed the risk and benefits of splenectomy, IVIG, rituximab and Promacta In the short-term, I would like to start her back on prednisone at 40 mg daily with plan to recheck CBC on Monday My goal would be to avoid platelet transfusion if possible We will get port placement next week and we will start her on weekly rituximab to see if we can get her ITP back in control We discussed the risk, benefits, side effects of rituximab and she is in agreement to proceed  Microcytic anemia She has signs of microcytic anemia I am concerned about recurrent iron deficiency I plan to recheck iron studies again in her next visit and replace as needed  Orders Placed This Encounter  Procedures   IR IMAGING GUIDED PORT INSERTION    Standing Status:   Future    Standing Expiration Date:   02/16/2022    Order Specific Question:   Reason for Exam (SYMPTOM  OR DIAGNOSIS REQUIRED)    Answer:   need port for infusion    Order Specific Question:   Is the patient pregnant?    Answer:   No    Order Specific Question:   Preferred Imaging Location?    Answer:   [facility]   Hepatitis B surface antigen    Standing Status:   Standing    Number of Occurrences:   1    Standing Expiration Date:   02/16/2022   Hepatitis B core antibody, total    Standing Status:   Standing    Number of Occurrences:   1    Standing Expiration Date:   02/16/2022   Comprehensive metabolic panel    Standing Status:   Standing    Number of Occurrences:   33    Standing Expiration Date:   02/16/2022   Ferritin    Standing Status:   Standing    Number of Occurrences:   2    Standing Expiration Date:   02/16/2022   Iron and TIBC    Standing Status:   Standing    Number of Occurrences:   2     Standing Expiration Date:   02/16/2022    The total time spent in the appointment was 40 minutes encounter with patients including review of chart and various tests results, discussions about plan of care and coordination of care plan   All questions were answered. The patient knows to call the clinic with any problems, questions or concerns. No barriers to learning was detected.    Heath Lark, MD 6/10/20226:13 PM  INTERVAL HISTORY: Stacey Orr 35 y.o. female returns for urgent evaluation I was informed her platelet count was low a week ago when she went to see her primary care doctor She denies recent bleeding Her last menstrual cycle was several months ago  SUMMARY OF HEMATOLOGIC HISTORY:  She was found to have abnormal CBC from recent blood draw Her baseline blood count from December 10, 2018 was normal.  White blood cell count 7.6, hemoglobin 12.6 and platelet count of 218 In April, she presented to local emergency room with high-grade fever of 102 Fahrenheit She was subsequently diagnosed with COVID-19 infection and was hospitalized According to results reviewed care everywhere, her platelet count dropped to as low as 30,000 but  improved 218,000 upon discharge She denies bleeding complications while hospitalized The patient have heavy menstruation.  Her date of her last menstrual period was on June 27 Since the birth of her son in April 2020, she have been having menorrhagia with regular cycle of every 30 days, typically she would bleed around 7 days She denies recent bruising/bleeding, such as spontaneous epistaxis, hematuria, melena or hematochezia  The patient denies history of liver disease, exposure to heparin, history of cardiac murmur/prior cardiovascular surgery or recent new medications She denies prior blood or platelet transfusions She does not take regular aspirin or NSAID According to scanned report, her referring physician repeated CBC on March 06, 2020 which showed  white count of 4.6, hemoglobin 11.9 with MCV of 78 and platelet count of 95,000 She was then referred here for further evaluation She received 2 doses of IV iron in October She had extensive work-up with blood draw for vitamin B12 level, autoimmune testing, hepatitis C, HIV viral antibody, and CT imaging came back normal.  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, likely triggered by COVID-19 infection this year  I have reviewed the past medical history, past surgical history, social history and family history with the patient and they are unchanged from previous note.  ALLERGIES:  is allergic to peanut-containing drug products and pecan nut (diagnostic).  MEDICATIONS:  Current Outpatient Medications  Medication Sig Dispense Refill   acetaminophen (TYLENOL) 500 MG tablet Take 500 mg by mouth every 6 (six) hours as needed for moderate pain.     lidocaine-prilocaine (EMLA) cream Apply to affected area once 30 g 3   predniSONE (DELTASONE) 20 MG tablet Take 3 tablets (60 mg total) by mouth daily with breakfast. 90 tablet 0   No current facility-administered medications for this visit.     REVIEW OF SYSTEMS:   Constitutional: Denies fevers, chills or night sweats Eyes: Denies blurriness of vision Ears, nose, mouth, throat, and face: Denies mucositis or sore throat Respiratory: Denies cough, dyspnea or wheezes Cardiovascular: Denies palpitation, chest discomfort or lower extremity swelling Gastrointestinal:  Denies nausea, heartburn or change in bowel habits Skin: Denies abnormal skin rashes Lymphatics: Denies new lymphadenopathy or easy bruising Neurological:Denies numbness, tingling or new weaknesses Behavioral/Psych: Mood is stable, no new changes  All other systems were reviewed with the patient and are negative.  PHYSICAL EXAMINATION: ECOG PERFORMANCE STATUS: 0 - Asymptomatic  Vitals:   02/16/21 1444  BP: (!) 151/89  Pulse: 91  Resp: 18  Temp:  97.7 F (36.5 C)  SpO2: 100%   Filed Weights   02/16/21 1444  Weight: (!) 327 lb 1.6 oz (148.4 kg)    GENERAL:alert, no distress and comfortable Musculoskeletal:no cyanosis of digits and no clubbing  NEURO: alert & oriented x 3 with fluent speech, no focal motor/sensory deficits  LABORATORY DATA:  I have reviewed the data as listed     Component Value Date/Time   NA 138 09/18/2020 0820   K 3.5 09/18/2020 0820   CL 105 09/18/2020 0820   CO2 23 09/18/2020 0820   GLUCOSE 111 (H) 09/18/2020 0820   BUN 17 09/18/2020 0820   CREATININE 0.88 09/18/2020 0820   CALCIUM 7.9 (L) 09/18/2020 0820   PROT 7.6 09/18/2020 0820   ALBUMIN 3.4 (L) 09/18/2020 0820   AST 9 (L) 09/18/2020 0820   ALT 14 09/18/2020 0820   ALKPHOS 57 09/18/2020 0820   BILITOT 0.4 09/18/2020 0820   GFRNONAA >60 09/18/2020 0820  No results found for: SPEP, UPEP  Lab Results  Component Value Date   WBC 5.3 02/16/2021   NEUTROABS 3.0 02/16/2021   HGB 11.9 (L) 02/16/2021   HCT 37.4 02/16/2021   MCV 77.8 (L) 02/16/2021   PLT 46 (L) 02/16/2021      Chemistry      Component Value Date/Time   NA 138 09/18/2020 0820   K 3.5 09/18/2020 0820   CL 105 09/18/2020 0820   CO2 23 09/18/2020 0820   BUN 17 09/18/2020 0820   CREATININE 0.88 09/18/2020 0820      Component Value Date/Time   CALCIUM 7.9 (L) 09/18/2020 0820   ALKPHOS 57 09/18/2020 0820   AST 9 (L) 09/18/2020 0820   ALT 14 09/18/2020 0820   BILITOT 0.4 09/18/2020 0820

## 2021-02-16 NOTE — Progress Notes (Signed)
START OFF PATHWAY REGIMEN - Other   [id number]:Rituximab (Weekly):   Administer weekly:     Rituximab-xxxx   **Always confirm dose/schedule in your pharmacy ordering system**  Patient Characteristics: Intent of Therapy: Curative Intent, Discussed with Patient 

## 2021-02-16 NOTE — Assessment & Plan Note (Signed)
Unfortunately, she has recurrent ITP again She is not symptomatic We have extensive discussions about treatment options We discussed the risk and benefits of splenectomy, IVIG, rituximab and Promacta In the short-term, I would like to start her back on prednisone at 40 mg daily with plan to recheck CBC on Monday My goal would be to avoid platelet transfusion if possible We will get port placement next week and we will start her on weekly rituximab to see if we can get her ITP back in control We discussed the risk, benefits, side effects of rituximab and she is in agreement to proceed

## 2021-02-19 ENCOUNTER — Inpatient Hospital Stay: Payer: 59

## 2021-02-19 ENCOUNTER — Other Ambulatory Visit (HOSPITAL_COMMUNITY): Payer: Self-pay

## 2021-02-19 ENCOUNTER — Other Ambulatory Visit: Payer: Self-pay

## 2021-02-19 DIAGNOSIS — Z79899 Other long term (current) drug therapy: Secondary | ICD-10-CM | POA: Diagnosis not present

## 2021-02-19 DIAGNOSIS — N920 Excessive and frequent menstruation with regular cycle: Secondary | ICD-10-CM

## 2021-02-19 DIAGNOSIS — D509 Iron deficiency anemia, unspecified: Secondary | ICD-10-CM | POA: Diagnosis not present

## 2021-02-19 DIAGNOSIS — D693 Immune thrombocytopenic purpura: Secondary | ICD-10-CM | POA: Diagnosis not present

## 2021-02-19 LAB — CBC WITH DIFFERENTIAL/PLATELET
Abs Immature Granulocytes: 0.02 10*3/uL (ref 0.00–0.07)
Basophils Absolute: 0 10*3/uL (ref 0.0–0.1)
Basophils Relative: 0 %
Eosinophils Absolute: 0 10*3/uL (ref 0.0–0.5)
Eosinophils Relative: 0 %
HCT: 37.4 % (ref 36.0–46.0)
Hemoglobin: 11.8 g/dL — ABNORMAL LOW (ref 12.0–15.0)
Immature Granulocytes: 0 %
Lymphocytes Relative: 26 %
Lymphs Abs: 2.3 10*3/uL (ref 0.7–4.0)
MCH: 24.4 pg — ABNORMAL LOW (ref 26.0–34.0)
MCHC: 31.6 g/dL (ref 30.0–36.0)
MCV: 77.4 fL — ABNORMAL LOW (ref 80.0–100.0)
Monocytes Absolute: 0.5 10*3/uL (ref 0.1–1.0)
Monocytes Relative: 6 %
Neutro Abs: 5.9 10*3/uL (ref 1.7–7.7)
Neutrophils Relative %: 68 %
Platelets: 86 10*3/uL — ABNORMAL LOW (ref 150–400)
RBC: 4.83 MIL/uL (ref 3.87–5.11)
RDW: 14.2 % (ref 11.5–15.5)
WBC: 8.8 10*3/uL (ref 4.0–10.5)
nRBC: 0 % (ref 0.0–0.2)

## 2021-02-19 LAB — COMPREHENSIVE METABOLIC PANEL
ALT: 12 U/L (ref 0–44)
AST: 13 U/L — ABNORMAL LOW (ref 15–41)
Albumin: 3.5 g/dL (ref 3.5–5.0)
Alkaline Phosphatase: 61 U/L (ref 38–126)
Anion gap: 8 (ref 5–15)
BUN: 11 mg/dL (ref 6–20)
CO2: 25 mmol/L (ref 22–32)
Calcium: 8.9 mg/dL (ref 8.9–10.3)
Chloride: 105 mmol/L (ref 98–111)
Creatinine, Ser: 0.76 mg/dL (ref 0.44–1.00)
GFR, Estimated: >60 mL/min (ref >60)
Glucose, Bld: 128 mg/dL — ABNORMAL HIGH (ref 70–99)
Potassium: 3.7 mmol/L (ref 3.5–5.1)
Sodium: 138 mmol/L (ref 135–145)
Total Bilirubin: 0.2 mg/dL — ABNORMAL LOW (ref 0.3–1.2)
Total Protein: 7.8 g/dL (ref 6.5–8.1)

## 2021-02-19 LAB — HEPATITIS B CORE ANTIBODY, TOTAL: Hep B Core Total Ab: NONREACTIVE

## 2021-02-19 LAB — HEPATITIS B SURFACE ANTIGEN: Hepatitis B Surface Ag: NONREACTIVE

## 2021-02-19 LAB — FERRITIN: Ferritin: 48 ng/mL (ref 11–307)

## 2021-02-19 LAB — IRON AND TIBC
Iron: 50 ug/dL (ref 41–142)
Saturation Ratios: 16 % — ABNORMAL LOW (ref 21–57)
TIBC: 311 ug/dL (ref 236–444)
UIBC: 261 ug/dL (ref 120–384)

## 2021-02-19 NOTE — Progress Notes (Signed)
Pharmacist Chemotherapy Monitoring - Initial Assessment    Anticipated start date: 02/26/21   Regimen:  Are orders appropriate based on the patient's diagnosis, regimen, and cycle? Yes Does the plan date match the patient's scheduled date? Yes Is the sequencing of drugs appropriate? Yes Are the premedications appropriate for the patient's regimen? Yes Prior Authorization for treatment is: Pending If applicable, is the correct biosimilar selected based on the patient's insurance? yes  Organ Function and Labs: Are dose adjustments needed based on the patient's renal function, hepatic function, or hematologic function? Yes Are appropriate labs ordered prior to the start of patient's treatment? Yes Other organ system assessment, if indicated: N/A The following baseline labs, if indicated, have been ordered: rituximab: baseline Hepatitis B labs  Dose Assessment: Are the drug doses appropriate? Yes Are the following correct: Drug concentrations Yes IV fluid compatible with drug Yes Administration routes Yes Timing of therapy Yes If applicable, does the patient have documented access for treatment and/or plans for port-a-cath placement? no If applicable, have lifetime cumulative doses been properly documented and assessed? yes Lifetime Dose Tracking  No doses have been documented on this patient for the following tracked chemicals: Doxorubicin, Epirubicin, Idarubicin, Daunorubicin, Mitoxantrone, Bleomycin, Oxaliplatin, Carboplatin, Liposomal Doxorubicin    Toxicity Monitoring/Prevention: The patient has the following take home antiemetics prescribed: N/A The patient has the following take home medications prescribed: N/A Medication allergies and previous infusion related reactions, if applicable, have been reviewed and addressed. No The patient's current medication list has been assessed for drug-drug interactions with their chemotherapy regimen. no significant drug-drug interactions were  identified on review.  Order Review: Are the treatment plan orders signed? Yes Is the patient scheduled to see a provider prior to their treatment? No  I verify that I have reviewed each item in the above checklist and answered each question accordingly.  Larene Beach, RPH, 02/19/2021  9:25 AM

## 2021-02-21 ENCOUNTER — Other Ambulatory Visit: Payer: Self-pay | Admitting: Radiology

## 2021-02-22 ENCOUNTER — Other Ambulatory Visit: Payer: Self-pay | Admitting: Radiology

## 2021-02-23 ENCOUNTER — Telehealth: Payer: Self-pay

## 2021-02-23 ENCOUNTER — Ambulatory Visit (HOSPITAL_COMMUNITY)
Admission: RE | Admit: 2021-02-23 | Discharge: 2021-02-23 | Disposition: A | Discharge status: Home / Self Care | Payer: 59 | Source: Ambulatory Visit | Attending: Hematology and Oncology | Admitting: Hematology and Oncology

## 2021-02-23 ENCOUNTER — Encounter (HOSPITAL_COMMUNITY): Payer: Self-pay

## 2021-02-23 ENCOUNTER — Other Ambulatory Visit: Payer: Self-pay

## 2021-02-23 DIAGNOSIS — D693 Immune thrombocytopenic purpura: Secondary | ICD-10-CM | POA: Diagnosis not present

## 2021-02-23 DIAGNOSIS — Z452 Encounter for adjustment and management of vascular access device: Secondary | ICD-10-CM | POA: Diagnosis not present

## 2021-02-23 DIAGNOSIS — Z8616 Personal history of COVID-19: Secondary | ICD-10-CM | POA: Insufficient documentation

## 2021-02-23 DIAGNOSIS — Z7952 Long term (current) use of systemic steroids: Secondary | ICD-10-CM | POA: Insufficient documentation

## 2021-02-23 HISTORY — PX: IR IMAGING GUIDED PORT INSERTION: IMG5740

## 2021-02-23 MED ORDER — FENTANYL CITRATE (PF) 100 MCG/2ML IJ SOLN
INTRAMUSCULAR | Status: AC
  Filled 2021-02-23: qty 2

## 2021-02-23 MED ORDER — LIDOCAINE HCL (PF) 1 % IJ SOLN
INTRAMUSCULAR | Status: AC | PRN
  Administered 2021-02-23: 10 mL

## 2021-02-23 MED ORDER — SODIUM CHLORIDE 0.9 % IV SOLN: INTRAVENOUS | Status: DC

## 2021-02-23 MED ORDER — LIDOCAINE-EPINEPHRINE 1 %-1:100000 IJ SOLN
INTRAMUSCULAR | Status: AC
  Filled 2021-02-23: qty 1

## 2021-02-23 MED ORDER — HEPARIN SOD (PORK) LOCK FLUSH 100 UNIT/ML IV SOLN
INTRAVENOUS | Status: AC
  Filled 2021-02-23: qty 5

## 2021-02-23 MED ORDER — MIDAZOLAM HCL 2 MG/2ML IJ SOLN
INTRAMUSCULAR | Status: AC
  Filled 2021-02-23: qty 4

## 2021-02-23 MED ORDER — LIDOCAINE HCL (PF) 1 % IJ SOLN
INTRAMUSCULAR | Status: AC
  Filled 2021-02-23: qty 30

## 2021-02-23 MED ORDER — FENTANYL CITRATE (PF) 100 MCG/2ML IJ SOLN
INTRAMUSCULAR | Status: AC | PRN
  Administered 2021-02-23: 25 ug via INTRAVENOUS
  Administered 2021-02-23 (×2): 50 ug via INTRAVENOUS
  Administered 2021-02-23: 25 ug via INTRAVENOUS

## 2021-02-23 MED ORDER — HEPARIN SOD (PORK) LOCK FLUSH 100 UNIT/ML IV SOLN
INTRAVENOUS | Status: AC | PRN
  Administered 2021-02-23: 500 [IU] via INTRAVENOUS

## 2021-02-23 MED ORDER — MIDAZOLAM HCL 2 MG/2ML IJ SOLN
INTRAMUSCULAR | Status: AC | PRN
  Administered 2021-02-23 (×4): 1 mg via INTRAVENOUS

## 2021-02-23 MED ORDER — FENTANYL CITRATE (PF) 100 MCG/2ML IJ SOLN
INTRAMUSCULAR | Status: AC
  Filled 2021-02-23: qty 4

## 2021-02-23 MED ORDER — LIDOCAINE-EPINEPHRINE (PF) 1 %-1:200000 IJ SOLN
INTRAMUSCULAR | Status: AC | PRN
  Administered 2021-02-23: 10 mL

## 2021-02-23 NOTE — Telephone Encounter (Signed)
Called per Dr. Alvy Bimler, port/flush appt changed to lab appt at [place]. Dr. Alvy Bimler is waiting for her insurance to call her back for a peer to peer regarding treatment on Monday. Allyssa verbalized understanding.

## 2021-02-23 NOTE — Procedures (Signed)
Interventional Radiology Procedure:   Indications: ITP  Procedure: Port placement  Findings: Right jugular port, tip at SVC/RA junction  Complications: None     EBL: Minimal, less than 10 ml  Plan: Discharge in one hour.  Keep port site and incisions dry for at least 24 hours.     Jaelle Campanile R. Anselm Pancoast, MD  Pager: [phone]

## 2021-02-23 NOTE — H&P (Signed)
Chief Complaint: Patient was seen in consultation today for acute ITP  Referring Physician(s): Stacey Orr  Supervising Physician: Stacey Orr  Patient Status: [facility] - Out-pt  History of Present Illness: Stacey Orr is a 35 y.o. female with past medical history of COVID-19 infection with subsequent asymptomatic thrombocytopenia which has been followed by Hematology for the past year.  She has again had recurrent drop in platelets down to 46K requiring intervention.  She now has plans for immunotherapy and is in need of durable venous access.   Stacey Orr presents to [facility] for procedure today.  She states Stacey Orr has recommended Port placement for initial 4 weeks of therapy with additional monitoring and potentially additional needs. Reviewed various options for access and she wishes to proceed with Port placement at this time.  She has been NPO.  Her husband is available today for transportation and care.  Platelet count at last assessment 4 days ago was 86K.   Past Medical History:  Diagnosis Date  . Depression    mild  . Headache   . Scoliosis   . SVD (spontaneous vaginal delivery) 12/10/2018    Past Surgical History:  Procedure Laterality Date  . BACK SURGERY    . CHOLECYSTECTOMY    . TONSILLECTOMY      Allergies: Pecan nut (diagnostic)  Medications: Prior to Admission medications   Medication Sig Start Date End Date Taking? Authorizing Provider  predniSONE (DELTASONE) 20 MG tablet Take 3 tablets (60 mg total) by mouth daily with breakfast. Patient taking differently: Take 20 mg by mouth daily with breakfast. 10/19/20  Yes Stacey Orr, Ni, MD  acetaminophen (TYLENOL) 500 MG tablet Take 500 mg by mouth every 6 (six) hours as needed for moderate pain.    [provider]  lidocaine-prilocaine (EMLA) cream Apply to affected area once 02/16/21   Stacey Lark, MD     History reviewed. No pertinent family history.  Social History   Socioeconomic History  .  Marital status: Married    Spouse name: Not on file  . Number of children: 2  . Years of education: Not on file  . Highest education level: Not on file  Occupational History  . Occupation: Marketing executive  Tobacco Use  . Smoking status: Never  . Smokeless tobacco: Never  Vaping Use  . Vaping Use: Never used  Substance and Sexual Activity  . Alcohol use: Never  . Drug use: Never  . Sexual activity: Yes  Other Topics Concern  . Not on file  Social History Narrative  . Not on file   Social Determinants of Health   Financial Resource Strain: Not on file  Food Insecurity: Not on file  Transportation Needs: Not on file  Physical Activity: Not on file  Stress: Not on file  Social Connections: Not on file     Review of Systems: A 12 point ROS discussed and pertinent positives are indicated in the HPI above.  All other systems are negative.  Review of Systems  Constitutional:  Negative for fatigue and fever.  Respiratory:  Negative for cough and shortness of breath.   Cardiovascular:  Negative for chest pain.  Gastrointestinal:  Negative for abdominal pain, nausea and vomiting.  Musculoskeletal:  Negative for back pain.  Psychiatric/Behavioral:  Negative for behavioral problems and confusion.    Vital Signs: BP (!) 143/66   Pulse 84   Temp 98.2 F (36.8 C) (Oral)   Resp 18   Ht 5\' 5"  (1.651 m)   Wt Marland Kitchen)  327 lb (148.3 kg)   LMP 11/23/2020 (Approximate) Comment: Reports irregular periods is her normal.  SpO2 100%   BMI 54.42 kg/m   Physical Exam Vitals and nursing note reviewed.  Constitutional:      General: She is not in acute distress.    Appearance: Normal appearance. She is not ill-appearing.  HENT:     Mouth/Throat:     Mouth: Mucous membranes are dry.  Cardiovascular:     Rate and Rhythm: Normal rate and regular rhythm.  Pulmonary:     Effort: Pulmonary effort is normal.     Breath sounds: Normal breath sounds.  Abdominal:     General: Abdomen is flat.      Palpations: Abdomen is soft.  Skin:    General: Skin is warm and dry.  Neurological:     General: No focal deficit present.     Mental Status: She is alert and oriented to person, place, and time. Mental status is at baseline.  Psychiatric:        Mood and Affect: Mood normal.        Behavior: Behavior normal.        Thought Content: Thought content normal.        Judgment: Judgment normal.     MD Evaluation Airway: WNL Heart: WNL Abdomen: WNL Chest/ Lungs: WNL ASA  Classification: 3 Mallampati/Airway Score: Two   Imaging: No results found.  Labs:  CBC: Recent Labs    10/26/20 1050 11/06/20 0907 02/16/21 1009 02/19/21 1028  WBC 13.3* 6.4 5.3 8.8  HGB 12.3 12.9 11.9* 11.8*  HCT 39.6 41.9 37.4 37.4  PLT 286 189 46* 86*    COAGS: No results for input(s): INR, APTT in the last 8760 hours.  BMP: Recent Labs    08/16/20 1302 09/11/20 0924 09/18/20 0820 02/19/21 1028  NA 136 138 138 138  K 3.7 3.8 3.5 3.7  CL 105 106 105 105  CO2 24 28 23 25   GLUCOSE 80 114* 111* 128*  BUN 9 10 17 11   CALCIUM 9.1 8.7* 7.9* 8.9  CREATININE 0.82 0.81 0.88 0.76  GFRNONAA >60 >60 >60 >60    LIVER FUNCTION TESTS: Recent Labs    08/16/20 1302 09/11/20 0924 09/18/20 0820 02/19/21 1028  BILITOT 0.4 0.3 0.4 0.2*  AST 20 18 9* 13*  ALT 16 17 14 12   ALKPHOS 83 74 57 61  PROT 8.6* 8.2* 7.6 7.8  ALBUMIN 3.8 3.6 3.4* 3.5    TUMOR MARKERS: No results for input(s): AFPTM, CEA, CA199, CHROMGRNA in the last 8760 hours.  Assessment and Plan: Patient with past medical history of COVID, now with acute ITP presents for Port-A-Cath placement.  Case reviewed by Dr. Anselm Orr who approves patient for procedure.  Patient presents today in their usual state of health.  She has been NPO and is not currently on blood thinners.   Risks and benefits of image guided port-a-catheter placement was discussed with the patient including, but not limited to bleeding, infection, pneumothorax, or  fibrin sheath development and need for additional procedures.  All of the patient's questions were answered, patient is agreeable to proceed. Consent signed and in chart.  Thank you for this interesting consult.  I greatly enjoyed meeting Stacey Orr and look forward to participating in their care.  A copy of this report was sent to the requesting provider on this date.  Electronically Signed: Docia Barrier, PA 02/23/2021, 9:52 AM   I spent a total of  30 Minutes   in face to face in clinical consultation, greater than 50% of which was counseling/coordinating care for acute ITP

## 2021-02-23 NOTE — Progress Notes (Signed)
Rituxan (brand) has been selected for use in this patient. Working on [employer] (brand) assistance w/ [employer].  Kennith Center, Pharm.D., CPP 02/23/2021@9 :40 AM

## 2021-02-26 ENCOUNTER — Inpatient Hospital Stay: Payer: 59

## 2021-02-26 ENCOUNTER — Other Ambulatory Visit: Payer: 59

## 2021-02-26 ENCOUNTER — Other Ambulatory Visit: Payer: Self-pay

## 2021-02-26 DIAGNOSIS — D693 Immune thrombocytopenic purpura: Secondary | ICD-10-CM | POA: Diagnosis not present

## 2021-02-26 DIAGNOSIS — Z79899 Other long term (current) drug therapy: Secondary | ICD-10-CM | POA: Diagnosis not present

## 2021-02-26 DIAGNOSIS — N920 Excessive and frequent menstruation with regular cycle: Secondary | ICD-10-CM

## 2021-02-26 DIAGNOSIS — D509 Iron deficiency anemia, unspecified: Secondary | ICD-10-CM | POA: Diagnosis not present

## 2021-02-26 LAB — CMP (CANCER CENTER ONLY)
ALT: 19 U/L (ref 0–44)
AST: 18 U/L (ref 15–41)
Albumin: 4 g/dL (ref 3.5–5.0)
Alkaline Phosphatase: 65 U/L (ref 38–126)
Anion gap: 8 (ref 5–15)
BUN: 14 mg/dL (ref 6–20)
CO2: 29 mmol/L (ref 22–32)
Calcium: 8.9 mg/dL (ref 8.9–10.3)
Chloride: 101 mmol/L (ref 98–111)
Creatinine: 0.86 mg/dL (ref 0.44–1.00)
GFR, Estimated: >60 mL/min (ref >60)
Glucose, Bld: 100 mg/dL — ABNORMAL HIGH (ref 70–99)
Potassium: 3.9 mmol/L (ref 3.5–5.1)
Sodium: 138 mmol/L (ref 135–145)
Total Bilirubin: 0.5 mg/dL (ref 0.3–1.2)
Total Protein: 8.3 g/dL — ABNORMAL HIGH (ref 6.5–8.1)

## 2021-02-26 LAB — CBC WITH DIFFERENTIAL/PLATELET
Abs Immature Granulocytes: 0.03 10*3/uL (ref 0.00–0.07)
Basophils Absolute: 0 10*3/uL (ref 0.0–0.1)
Basophils Relative: 0 %
Eosinophils Absolute: 0.1 10*3/uL (ref 0.0–0.5)
Eosinophils Relative: 1 %
HCT: 41.4 % (ref 36.0–46.0)
Hemoglobin: 12.9 g/dL (ref 12.0–15.0)
Immature Granulocytes: 0 %
Lymphocytes Relative: 32 %
Lymphs Abs: 3.2 10*3/uL (ref 0.7–4.0)
MCH: 24 pg — ABNORMAL LOW (ref 26.0–34.0)
MCHC: 31.2 g/dL (ref 30.0–36.0)
MCV: 77.1 fL — ABNORMAL LOW (ref 80.0–100.0)
Monocytes Absolute: 0.6 10*3/uL (ref 0.1–1.0)
Monocytes Relative: 6 %
Neutro Abs: 6.1 10*3/uL (ref 1.7–7.7)
Neutrophils Relative %: 61 %
Platelets: 171 10*3/uL (ref 150–400)
RBC: 5.37 MIL/uL — ABNORMAL HIGH (ref 3.87–5.11)
RDW: 14.4 % (ref 11.5–15.5)
WBC: 10 10*3/uL (ref 4.0–10.5)
nRBC: 0 % (ref 0.0–0.2)

## 2021-03-01 ENCOUNTER — Telehealth: Payer: Self-pay | Admitting: Hematology and Oncology

## 2021-03-01 NOTE — Telephone Encounter (Signed)
I spoke with medical director from her insurance company He denies rituximab treatment on the basis that her platelet count has never fallen under 30,000, despite intolerance to prednisone taper I reviewed the plan with the patient We are in agreement to taper off prednisone She will get her labs checked weekly and I plan to see her again next month for further follow-up

## 2021-03-05 ENCOUNTER — Ambulatory Visit: Payer: 59

## 2021-03-05 ENCOUNTER — Other Ambulatory Visit: Payer: 59

## 2021-03-05 ENCOUNTER — Inpatient Hospital Stay: Payer: 59

## 2021-03-05 ENCOUNTER — Ambulatory Visit: Payer: 59 | Admitting: Hematology and Oncology

## 2021-03-05 ENCOUNTER — Other Ambulatory Visit: Payer: Self-pay

## 2021-03-05 ENCOUNTER — Telehealth: Payer: Self-pay | Admitting: Hematology and Oncology

## 2021-03-05 DIAGNOSIS — D693 Immune thrombocytopenic purpura: Secondary | ICD-10-CM

## 2021-03-05 DIAGNOSIS — D509 Iron deficiency anemia, unspecified: Secondary | ICD-10-CM | POA: Diagnosis not present

## 2021-03-05 DIAGNOSIS — Z79899 Other long term (current) drug therapy: Secondary | ICD-10-CM | POA: Diagnosis not present

## 2021-03-05 DIAGNOSIS — N920 Excessive and frequent menstruation with regular cycle: Secondary | ICD-10-CM

## 2021-03-05 LAB — COMPREHENSIVE METABOLIC PANEL
ALT: 22 U/L (ref 0–44)
AST: 16 U/L (ref 15–41)
Albumin: 3.6 g/dL (ref 3.5–5.0)
Alkaline Phosphatase: 89 U/L (ref 38–126)
Anion gap: 11 (ref 5–15)
BUN: 11 mg/dL (ref 6–20)
CO2: 25 mmol/L (ref 22–32)
Calcium: 8.9 mg/dL (ref 8.9–10.3)
Chloride: 102 mmol/L (ref 98–111)
Creatinine, Ser: 0.82 mg/dL (ref 0.44–1.00)
GFR, Estimated: >60 mL/min (ref >60)
Glucose, Bld: 97 mg/dL (ref 70–99)
Potassium: 3.9 mmol/L (ref 3.5–5.1)
Sodium: 138 mmol/L (ref 135–145)
Total Bilirubin: 0.4 mg/dL (ref 0.3–1.2)
Total Protein: 8.1 g/dL (ref 6.5–8.1)

## 2021-03-05 LAB — CBC WITH DIFFERENTIAL/PLATELET
Abs Immature Granulocytes: 0.03 10*3/uL (ref 0.00–0.07)
Basophils Absolute: 0 10*3/uL (ref 0.0–0.1)
Basophils Relative: 0 %
Eosinophils Absolute: 0.1 10*3/uL (ref 0.0–0.5)
Eosinophils Relative: 1 %
HCT: 39.9 % (ref 36.0–46.0)
Hemoglobin: 12.3 g/dL (ref 12.0–15.0)
Immature Granulocytes: 0 %
Lymphocytes Relative: 30 %
Lymphs Abs: 2.8 10*3/uL (ref 0.7–4.0)
MCH: 24.2 pg — ABNORMAL LOW (ref 26.0–34.0)
MCHC: 30.8 g/dL (ref 30.0–36.0)
MCV: 78.4 fL — ABNORMAL LOW (ref 80.0–100.0)
Monocytes Absolute: 0.7 10*3/uL (ref 0.1–1.0)
Monocytes Relative: 8 %
Neutro Abs: 5.8 10*3/uL (ref 1.7–7.7)
Neutrophils Relative %: 61 %
Platelets: 124 10*3/uL — ABNORMAL LOW (ref 150–400)
RBC: 5.09 MIL/uL (ref 3.87–5.11)
RDW: 14.2 % (ref 11.5–15.5)
WBC: 9.4 10*3/uL (ref 4.0–10.5)
nRBC: 0 % (ref 0.0–0.2)

## 2021-03-05 NOTE — Telephone Encounter (Signed)
Briefly reviewed her CBC results and plan of care with the patient She will go home and think about her options and will remain on 10 mg of prednisone for now

## 2021-03-19 ENCOUNTER — Inpatient Hospital Stay: Payer: 59

## 2021-03-19 ENCOUNTER — Ambulatory Visit: Payer: 59

## 2021-03-19 ENCOUNTER — Other Ambulatory Visit (HOSPITAL_COMMUNITY): Payer: Self-pay

## 2021-03-19 ENCOUNTER — Inpatient Hospital Stay: Payer: 59 | Attending: Hematology and Oncology

## 2021-03-19 ENCOUNTER — Telehealth: Payer: Self-pay | Admitting: Hematology and Oncology

## 2021-03-19 ENCOUNTER — Other Ambulatory Visit: Payer: Self-pay | Admitting: Hematology and Oncology

## 2021-03-19 ENCOUNTER — Other Ambulatory Visit: Payer: 59

## 2021-03-19 ENCOUNTER — Other Ambulatory Visit: Payer: Self-pay

## 2021-03-19 DIAGNOSIS — Z6841 Body Mass Index (BMI) 40.0 and over, adult: Secondary | ICD-10-CM | POA: Insufficient documentation

## 2021-03-19 DIAGNOSIS — D693 Immune thrombocytopenic purpura: Secondary | ICD-10-CM | POA: Diagnosis not present

## 2021-03-19 DIAGNOSIS — Z8616 Personal history of COVID-19: Secondary | ICD-10-CM | POA: Insufficient documentation

## 2021-03-19 DIAGNOSIS — N920 Excessive and frequent menstruation with regular cycle: Secondary | ICD-10-CM | POA: Insufficient documentation

## 2021-03-19 DIAGNOSIS — D509 Iron deficiency anemia, unspecified: Secondary | ICD-10-CM | POA: Insufficient documentation

## 2021-03-19 LAB — COMPREHENSIVE METABOLIC PANEL
ALT: 18 U/L (ref 0–44)
AST: 16 U/L (ref 15–41)
Albumin: 3.4 g/dL — ABNORMAL LOW (ref 3.5–5.0)
Alkaline Phosphatase: 73 U/L (ref 38–126)
Anion gap: 9 (ref 5–15)
BUN: 10 mg/dL (ref 6–20)
CO2: 24 mmol/L (ref 22–32)
Calcium: 8.9 mg/dL (ref 8.9–10.3)
Chloride: 105 mmol/L (ref 98–111)
Creatinine, Ser: 0.78 mg/dL (ref 0.44–1.00)
GFR, Estimated: >60 mL/min (ref >60)
Glucose, Bld: 113 mg/dL — ABNORMAL HIGH (ref 70–99)
Potassium: 3.8 mmol/L (ref 3.5–5.1)
Sodium: 138 mmol/L (ref 135–145)
Total Bilirubin: 0.4 mg/dL (ref 0.3–1.2)
Total Protein: 7.5 g/dL (ref 6.5–8.1)

## 2021-03-19 LAB — CBC WITH DIFFERENTIAL/PLATELET
Abs Immature Granulocytes: 0.01 10*3/uL (ref 0.00–0.07)
Basophils Absolute: 0 10*3/uL (ref 0.0–0.1)
Basophils Relative: 0 %
Eosinophils Absolute: 0.1 10*3/uL (ref 0.0–0.5)
Eosinophils Relative: 2 %
HCT: 38.4 % (ref 36.0–46.0)
Hemoglobin: 12.1 g/dL (ref 12.0–15.0)
Immature Granulocytes: 0 %
Lymphocytes Relative: 31 %
Lymphs Abs: 1.7 10*3/uL (ref 0.7–4.0)
MCH: 24.1 pg — ABNORMAL LOW (ref 26.0–34.0)
MCHC: 31.5 g/dL (ref 30.0–36.0)
MCV: 76.5 fL — ABNORMAL LOW (ref 80.0–100.0)
Monocytes Absolute: 0.4 10*3/uL (ref 0.1–1.0)
Monocytes Relative: 8 %
Neutro Abs: 3.1 10*3/uL (ref 1.7–7.7)
Neutrophils Relative %: 59 %
Platelets: 145 10*3/uL — ABNORMAL LOW (ref 150–400)
RBC: 5.02 MIL/uL (ref 3.87–5.11)
RDW: 14.6 % (ref 11.5–15.5)
WBC: 5.3 10*3/uL (ref 4.0–10.5)
nRBC: 0 % (ref 0.0–0.2)

## 2021-03-19 MED ORDER — PREDNISONE 5 MG PO TABS
5.0000 mg | ORAL_TABLET | Freq: Every day | ORAL | 1 refills | Status: DC
  Filled 2021-03-19: qty 30, 30d supply, fill #0

## 2021-03-19 NOTE — Telephone Encounter (Signed)
Review her blood test briefly We will reduce her prednisone to 5 mg daily and recheck next week

## 2021-03-26 ENCOUNTER — Encounter: Payer: Self-pay | Admitting: Hematology and Oncology

## 2021-03-26 ENCOUNTER — Inpatient Hospital Stay: Payer: 59

## 2021-03-26 ENCOUNTER — Inpatient Hospital Stay (HOSPITAL_BASED_OUTPATIENT_CLINIC_OR_DEPARTMENT_OTHER): Payer: 59 | Admitting: Hematology and Oncology

## 2021-03-26 ENCOUNTER — Ambulatory Visit: Payer: 59

## 2021-03-26 ENCOUNTER — Other Ambulatory Visit: Payer: 59

## 2021-03-26 ENCOUNTER — Other Ambulatory Visit: Payer: Self-pay

## 2021-03-26 DIAGNOSIS — D693 Immune thrombocytopenic purpura: Secondary | ICD-10-CM

## 2021-03-26 DIAGNOSIS — N920 Excessive and frequent menstruation with regular cycle: Secondary | ICD-10-CM

## 2021-03-26 DIAGNOSIS — Z6841 Body Mass Index (BMI) 40.0 and over, adult: Secondary | ICD-10-CM | POA: Diagnosis not present

## 2021-03-26 DIAGNOSIS — D509 Iron deficiency anemia, unspecified: Secondary | ICD-10-CM

## 2021-03-26 DIAGNOSIS — E66813 Obesity, class 3: Secondary | ICD-10-CM

## 2021-03-26 DIAGNOSIS — Z8616 Personal history of COVID-19: Secondary | ICD-10-CM | POA: Diagnosis not present

## 2021-03-26 LAB — CBC WITH DIFFERENTIAL/PLATELET
Abs Immature Granulocytes: 0.02 10*3/uL (ref 0.00–0.07)
Basophils Absolute: 0 10*3/uL (ref 0.0–0.1)
Basophils Relative: 0 %
Eosinophils Absolute: 0.1 10*3/uL (ref 0.0–0.5)
Eosinophils Relative: 1 %
HCT: 39.9 % (ref 36.0–46.0)
Hemoglobin: 12.5 g/dL (ref 12.0–15.0)
Immature Granulocytes: 0 %
Lymphocytes Relative: 15 %
Lymphs Abs: 1.3 10*3/uL (ref 0.7–4.0)
MCH: 24.6 pg — ABNORMAL LOW (ref 26.0–34.0)
MCHC: 31.3 g/dL (ref 30.0–36.0)
MCV: 78.5 fL — ABNORMAL LOW (ref 80.0–100.0)
Monocytes Absolute: 0.7 10*3/uL (ref 0.1–1.0)
Monocytes Relative: 8 %
Neutro Abs: 6.7 10*3/uL (ref 1.7–7.7)
Neutrophils Relative %: 76 %
Platelets: 163 10*3/uL (ref 150–400)
RBC: 5.08 MIL/uL (ref 3.87–5.11)
RDW: 14.6 % (ref 11.5–15.5)
WBC: 8.8 10*3/uL (ref 4.0–10.5)
nRBC: 0 % (ref 0.0–0.2)

## 2021-03-26 LAB — COMPREHENSIVE METABOLIC PANEL
ALT: 19 U/L (ref 0–44)
AST: 17 U/L (ref 15–41)
Albumin: 3.5 g/dL (ref 3.5–5.0)
Alkaline Phosphatase: 83 U/L (ref 38–126)
Anion gap: 9 (ref 5–15)
BUN: 8 mg/dL (ref 6–20)
CO2: 28 mmol/L (ref 22–32)
Calcium: 9.2 mg/dL (ref 8.9–10.3)
Chloride: 102 mmol/L (ref 98–111)
Creatinine, Ser: 0.78 mg/dL (ref 0.44–1.00)
GFR, Estimated: >60 mL/min (ref >60)
Glucose, Bld: 124 mg/dL — ABNORMAL HIGH (ref 70–99)
Potassium: 3.9 mmol/L (ref 3.5–5.1)
Sodium: 139 mmol/L (ref 135–145)
Total Bilirubin: 0.3 mg/dL (ref 0.3–1.2)
Total Protein: 8.1 g/dL (ref 6.5–8.1)

## 2021-03-26 MED ORDER — PREDNISONE 5 MG PO TABS: ORAL_TABLET | ORAL | 1 refills | Status: DC

## 2021-03-26 NOTE — Assessment & Plan Note (Signed)
She has signs of microcytic anemia Her recent iron studies are borderline low Observe for now

## 2021-03-26 NOTE — Progress Notes (Signed)
[place] OFFICE PROGRESS NOTE  Orr, Stacey Quin, DO  ASSESSMENT & PLAN:  Acute ITP ([place]) Her platelet count has normalized We are able to treat her successfully with another course of steroids, but at the expense of causing more weight gain I will continue to help her with prednisone taper She will start prednisone taper with 5 mg every other day She will get another repeat labs at the end of the month If her repeat platelet count is normal, she will stop prednisone altogether We will get her port flushed and maintain With the next relapse, per previous discussion, we will get her platelet count down to less than 30,000 to get insurance approval for rituximab as the next line of treatment  Microcytic anemia She has signs of microcytic anemia Her recent iron studies are borderline low Observe for now  Obesity, Class III, BMI 40-49.9 (morbid obesity) ([place]) She has gained a lot of weight due to recent prednisone treatment I encouraged the patient to increase oral fluid intake and try to lose weight again when she is off prednisone therapy at the end of the month  No orders of the defined types were placed in this encounter.   The total time spent in the appointment was 20 minutes encounter with patients including review of chart and various tests results, discussions about plan of care and coordination of care plan   All questions were answered. The patient knows to call the clinic with any problems, questions or concerns. No barriers to learning was detected.    Stacey Lark, MD 7/18/20222:20 PM  INTERVAL HISTORY: Stacey Orr 35 y.o. female returns for further treatment and follow-up She is concerned about her recent weight gain She has no recent bleeding  SUMMARY OF HEMATOLOGIC HISTORY:  She was found to have abnormal CBC from recent blood draw Her baseline blood count from December 10, 2018 was normal.  White blood cell count 7.6, hemoglobin 12.6 and  platelet count of 218 In April, she presented to local emergency room with high-grade fever of 102 Fahrenheit She was subsequently diagnosed with COVID-19 infection and was hospitalized According to results reviewed care everywhere, her platelet count dropped to as low as 30,000 but improved 218,000 upon discharge She denies bleeding complications while hospitalized The patient have heavy menstruation.  Her date of her last menstrual period was on June 27 Since the birth of her son in April 2020, she have been having menorrhagia with regular cycle of every 30 days, typically she would bleed around 7 days She denies recent bruising/bleeding, such as spontaneous epistaxis, hematuria, melena or hematochezia  The patient denies history of liver disease, exposure to heparin, history of cardiac murmur/prior cardiovascular surgery or recent new medications She denies prior blood or platelet transfusions She does not take regular aspirin or NSAID According to scanned report, her referring physician repeated CBC on March 06, 2020 which showed white count of 4.6, hemoglobin 11.9 with MCV of 78 and platelet count of 95,000 She was then referred here for further evaluation She received 2 doses of IV iron in October She had extensive work-up with blood draw for vitamin B12 level, autoimmune testing, hepatitis C, HIV viral antibody, and CT imaging came back normal.  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  She responded well  to prednisone therapy  I have reviewed the past medical history, past surgical history, social history and family history with the patient and they are unchanged from previous note.  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  MEDICATIONS:  Current Outpatient  Medications  Medication Sig Dispense Refill   acetaminophen (TYLENOL) 500 MG tablet Take 500 mg by mouth every 6 (six) hours as needed for moderate pain.     lidocaine-prilocaine (EMLA) cream Apply to affected area once 30 g 3   predniSONE (DELTASONE) 5 MG tablet Take 5 mg every other day until further instructions 30 tablet 1   No current facility-administered medications for this visit.     REVIEW OF SYSTEMS:   Constitutional: Denies fevers, chills or night sweats Eyes: Denies blurriness of vision Ears, nose, mouth, throat, and face: Denies mucositis or sore throat Respiratory: Denies cough, dyspnea or wheezes Cardiovascular: Denies palpitation, chest discomfort or lower extremity swelling Gastrointestinal:  Denies nausea, heartburn or change in bowel habits Skin: Denies abnormal skin rashes Lymphatics: Denies new lymphadenopathy or easy bruising Neurological:Denies numbness, tingling or new weaknesses Behavioral/Psych: Mood is stable, no new changes  All other systems were reviewed with the patient and are negative.  PHYSICAL EXAMINATION: ECOG PERFORMANCE STATUS: 0 - Asymptomatic  Vitals:   03/26/21 1258  BP: (!) 149/99  Pulse: (!) 101  Resp: 18  Temp: 98 F (36.7 C)  SpO2: 100%   Filed Weights   03/26/21 1258  Weight: (!) 328 lb 12.8 oz (149.1 kg)    GENERAL:alert, no distress and comfortable Musculoskeletal:no cyanosis of digits and no clubbing  NEURO: alert & oriented x 3 with fluent speech, no focal motor/sensory deficits  LABORATORY DATA:  I have reviewed the data as listed     Component Value Date/Time   NA 139 03/26/2021 1226   K 3.9 03/26/2021 1226   CL 102 03/26/2021 1226   CO2 28 03/26/2021 1226   GLUCOSE 124 (H) 03/26/2021 1226   BUN 8 03/26/2021 1226   CREATININE 0.78 03/26/2021 1226   CREATININE 0.86 02/26/2021 0903   CALCIUM 9.2 03/26/2021 1226   PROT 8.1 03/26/2021 1226   ALBUMIN 3.5 03/26/2021 1226   AST 17 03/26/2021 1226   AST 18  02/26/2021 0903   ALT 19 03/26/2021 1226   ALT 19 02/26/2021 0903   ALKPHOS 83 03/26/2021 1226   BILITOT 0.3 03/26/2021 1226   BILITOT 0.5 02/26/2021 0903   GFRNONAA >60 03/26/2021 1226   GFRNONAA >60 02/26/2021 0903    No results found for: SPEP, UPEP  Lab Results  Component Value Date   WBC 8.8 03/26/2021   NEUTROABS 6.7 03/26/2021   HGB 12.5 03/26/2021   HCT 39.9 03/26/2021   MCV 78.5 (L) 03/26/2021   PLT 163 03/26/2021      Chemistry      Component Value Date/Time   NA 139 03/26/2021 1226   K 3.9 03/26/2021 1226   CL 102 03/26/2021 1226   CO2 28 03/26/2021 1226   BUN 8 03/26/2021 1226   CREATININE 0.78 03/26/2021 1226   CREATININE 0.86 02/26/2021 0903      Component Value Date/Time   CALCIUM 9.2 03/26/2021 1226   ALKPHOS 83 03/26/2021 1226   AST 17 03/26/2021 1226   AST 18 02/26/2021 0903   ALT 19 03/26/2021 1226   ALT 19 02/26/2021 0903   BILITOT 0.3 03/26/2021 1226   BILITOT 0.5 02/26/2021 8676

## 2021-03-26 NOTE — Assessment & Plan Note (Signed)
She has gained a lot of weight due to recent prednisone treatment I encouraged the patient to increase oral fluid intake and try to lose weight again when she is off prednisone therapy at the end of the month

## 2021-03-26 NOTE — Assessment & Plan Note (Signed)
Her platelet count has normalized We are able to treat her successfully with another course of steroids, but at the expense of causing more weight gain I will continue to help her with prednisone taper She will start prednisone taper with 5 mg every other day She will get another repeat labs at the end of the month If her repeat platelet count is normal, she will stop prednisone altogether We will get her port flushed and maintain With the next relapse, per previous discussion, we will get her platelet count down to less than 30,000 to get insurance approval for rituximab as the next line of treatment

## 2021-04-06 ENCOUNTER — Other Ambulatory Visit: Payer: Self-pay

## 2021-04-06 ENCOUNTER — Inpatient Hospital Stay: Payer: 59

## 2021-04-06 DIAGNOSIS — Z6841 Body Mass Index (BMI) 40.0 and over, adult: Secondary | ICD-10-CM | POA: Diagnosis not present

## 2021-04-06 DIAGNOSIS — D693 Immune thrombocytopenic purpura: Secondary | ICD-10-CM

## 2021-04-06 DIAGNOSIS — N920 Excessive and frequent menstruation with regular cycle: Secondary | ICD-10-CM

## 2021-04-06 DIAGNOSIS — D509 Iron deficiency anemia, unspecified: Secondary | ICD-10-CM

## 2021-04-06 DIAGNOSIS — D539 Nutritional anemia, unspecified: Secondary | ICD-10-CM

## 2021-04-06 DIAGNOSIS — Z8616 Personal history of COVID-19: Secondary | ICD-10-CM | POA: Diagnosis not present

## 2021-04-06 LAB — CBC WITH DIFFERENTIAL/PLATELET
Abs Immature Granulocytes: 0.03 10*3/uL (ref 0.00–0.07)
Basophils Absolute: 0 10*3/uL (ref 0.0–0.1)
Basophils Relative: 0 %
Eosinophils Absolute: 0.1 10*3/uL (ref 0.0–0.5)
Eosinophils Relative: 1 %
HCT: 37.9 % (ref 36.0–46.0)
Hemoglobin: 12.2 g/dL (ref 12.0–15.0)
Immature Granulocytes: 1 %
Lymphocytes Relative: 30 %
Lymphs Abs: 1.9 10*3/uL (ref 0.7–4.0)
MCH: 24.9 pg — ABNORMAL LOW (ref 26.0–34.0)
MCHC: 32.2 g/dL (ref 30.0–36.0)
MCV: 77.5 fL — ABNORMAL LOW (ref 80.0–100.0)
Monocytes Absolute: 0.6 10*3/uL (ref 0.1–1.0)
Monocytes Relative: 10 %
Neutro Abs: 3.7 10*3/uL (ref 1.7–7.7)
Neutrophils Relative %: 58 %
Platelets: 166 10*3/uL (ref 150–400)
RBC: 4.89 MIL/uL (ref 3.87–5.11)
RDW: 14.7 % (ref 11.5–15.5)
WBC: 6.2 10*3/uL (ref 4.0–10.5)
nRBC: 0 % (ref 0.0–0.2)

## 2021-04-06 LAB — COMPREHENSIVE METABOLIC PANEL
ALT: 12 U/L (ref 0–44)
AST: 14 U/L — ABNORMAL LOW (ref 15–41)
Albumin: 3.4 g/dL — ABNORMAL LOW (ref 3.5–5.0)
Alkaline Phosphatase: 79 U/L (ref 38–126)
Anion gap: 9 (ref 5–15)
BUN: 9 mg/dL (ref 6–20)
CO2: 24 mmol/L (ref 22–32)
Calcium: 8.8 mg/dL — ABNORMAL LOW (ref 8.9–10.3)
Chloride: 107 mmol/L (ref 98–111)
Creatinine, Ser: 0.81 mg/dL (ref 0.44–1.00)
GFR, Estimated: >60 mL/min (ref >60)
Glucose, Bld: 108 mg/dL — ABNORMAL HIGH (ref 70–99)
Potassium: 3.6 mmol/L (ref 3.5–5.1)
Sodium: 140 mmol/L (ref 135–145)
Total Bilirubin: 0.3 mg/dL (ref 0.3–1.2)
Total Protein: 7.6 g/dL (ref 6.5–8.1)

## 2021-04-06 MED ORDER — HEPARIN SOD (PORK) LOCK FLUSH 100 UNIT/ML IV SOLN
500.0000 [IU] | Freq: Once | INTRAVENOUS | Status: AC | PRN
Start: 2021-04-06 — End: 2021-04-06
  Administered 2021-04-06: 500 [IU]
  Filled 2021-04-06: qty 5

## 2021-04-06 MED ORDER — SODIUM CHLORIDE 0.9% FLUSH
10.0000 mL | Freq: Once | INTRAVENOUS | Status: AC | PRN
  Administered 2021-04-06: 10 mL
  Filled 2021-04-06: qty 10

## 2021-05-07 ENCOUNTER — Other Ambulatory Visit: Payer: Self-pay

## 2021-05-07 ENCOUNTER — Other Ambulatory Visit: Payer: 59

## 2021-05-07 ENCOUNTER — Telehealth: Payer: Self-pay | Admitting: Hematology and Oncology

## 2021-05-07 ENCOUNTER — Inpatient Hospital Stay: Payer: 59 | Attending: Hematology and Oncology

## 2021-05-07 DIAGNOSIS — Z95828 Presence of other vascular implants and grafts: Secondary | ICD-10-CM

## 2021-05-07 DIAGNOSIS — D693 Immune thrombocytopenic purpura: Secondary | ICD-10-CM | POA: Diagnosis not present

## 2021-05-07 DIAGNOSIS — N920 Excessive and frequent menstruation with regular cycle: Secondary | ICD-10-CM

## 2021-05-07 DIAGNOSIS — D509 Iron deficiency anemia, unspecified: Secondary | ICD-10-CM | POA: Diagnosis not present

## 2021-05-07 LAB — CBC WITH DIFFERENTIAL/PLATELET
Abs Immature Granulocytes: 0.01 10*3/uL (ref 0.00–0.07)
Basophils Absolute: 0 10*3/uL (ref 0.0–0.1)
Basophils Relative: 1 %
Eosinophils Absolute: 0.1 10*3/uL (ref 0.0–0.5)
Eosinophils Relative: 1 %
HCT: 37.9 % (ref 36.0–46.0)
Hemoglobin: 11.9 g/dL — ABNORMAL LOW (ref 12.0–15.0)
Immature Granulocytes: 0 %
Lymphocytes Relative: 27 %
Lymphs Abs: 1.6 10*3/uL (ref 0.7–4.0)
MCH: 24.5 pg — ABNORMAL LOW (ref 26.0–34.0)
MCHC: 31.4 g/dL (ref 30.0–36.0)
MCV: 78.1 fL — ABNORMAL LOW (ref 80.0–100.0)
Monocytes Absolute: 0.4 10*3/uL (ref 0.1–1.0)
Monocytes Relative: 7 %
Neutro Abs: 3.7 10*3/uL (ref 1.7–7.7)
Neutrophils Relative %: 64 %
Platelets: 143 10*3/uL — ABNORMAL LOW (ref 150–400)
RBC: 4.85 MIL/uL (ref 3.87–5.11)
RDW: 14.1 % (ref 11.5–15.5)
WBC: 5.8 10*3/uL (ref 4.0–10.5)
nRBC: 0 % (ref 0.0–0.2)

## 2021-05-07 LAB — IRON AND TIBC
Iron: 79 ug/dL (ref 41–142)
Saturation Ratios: 25 % (ref 21–57)
TIBC: 319 ug/dL (ref 236–444)
UIBC: 240 ug/dL (ref 120–384)

## 2021-05-07 LAB — FERRITIN: Ferritin: 34 ng/mL (ref 11–307)

## 2021-05-07 MED ORDER — HEPARIN SOD (PORK) LOCK FLUSH 100 UNIT/ML IV SOLN
500.0000 [IU] | INTRAVENOUS | Status: AC | PRN
  Administered 2021-05-07: 500 [IU]

## 2021-05-07 MED ORDER — SODIUM CHLORIDE 0.9% FLUSH
10.0000 mL | INTRAVENOUS | Status: AC | PRN
  Administered 2021-05-07: 10 mL

## 2021-05-07 NOTE — Telephone Encounter (Signed)
I have reviewed CBC briefly with the patient She is mildly pancytopenic She is not symptomatic I recommend recheck CBC next week

## 2021-05-15 ENCOUNTER — Encounter (INDEPENDENT_AMBULATORY_CARE_PROVIDER_SITE_OTHER): Payer: Self-pay

## 2021-05-15 ENCOUNTER — Other Ambulatory Visit: Payer: Self-pay

## 2021-05-15 ENCOUNTER — Inpatient Hospital Stay: Payer: 59 | Attending: Hematology and Oncology

## 2021-05-15 DIAGNOSIS — D693 Immune thrombocytopenic purpura: Secondary | ICD-10-CM | POA: Insufficient documentation

## 2021-05-15 DIAGNOSIS — D539 Nutritional anemia, unspecified: Secondary | ICD-10-CM

## 2021-05-15 DIAGNOSIS — D509 Iron deficiency anemia, unspecified: Secondary | ICD-10-CM | POA: Insufficient documentation

## 2021-05-15 DIAGNOSIS — N920 Excessive and frequent menstruation with regular cycle: Secondary | ICD-10-CM

## 2021-05-15 LAB — CBC WITH DIFFERENTIAL/PLATELET
Abs Immature Granulocytes: 0.01 10*3/uL (ref 0.00–0.07)
Basophils Absolute: 0 10*3/uL (ref 0.0–0.1)
Basophils Relative: 0 %
Eosinophils Absolute: 0.1 10*3/uL (ref 0.0–0.5)
Eosinophils Relative: 2 %
HCT: 36.3 % (ref 36.0–46.0)
Hemoglobin: 11.5 g/dL — ABNORMAL LOW (ref 12.0–15.0)
Immature Granulocytes: 0 %
Lymphocytes Relative: 27 %
Lymphs Abs: 1.5 10*3/uL (ref 0.7–4.0)
MCH: 24.6 pg — ABNORMAL LOW (ref 26.0–34.0)
MCHC: 31.7 g/dL (ref 30.0–36.0)
MCV: 77.6 fL — ABNORMAL LOW (ref 80.0–100.0)
Monocytes Absolute: 0.4 10*3/uL (ref 0.1–1.0)
Monocytes Relative: 8 %
Neutro Abs: 3.4 10*3/uL (ref 1.7–7.7)
Neutrophils Relative %: 63 %
Platelets: 162 10*3/uL (ref 150–400)
RBC: 4.68 MIL/uL (ref 3.87–5.11)
RDW: 14.2 % (ref 11.5–15.5)
WBC: 5.4 10*3/uL (ref 4.0–10.5)
nRBC: 0 % (ref 0.0–0.2)

## 2021-05-15 MED ORDER — SODIUM CHLORIDE 0.9% FLUSH
10.0000 mL | Freq: Once | INTRAVENOUS | Status: AC | PRN
  Administered 2021-05-15: 10 mL

## 2021-05-15 MED ORDER — HEPARIN SOD (PORK) LOCK FLUSH 100 UNIT/ML IV SOLN
500.0000 [IU] | Freq: Once | INTRAVENOUS | Status: AC | PRN
  Administered 2021-05-15: 500 [IU]

## 2021-05-23 ENCOUNTER — Other Ambulatory Visit: Payer: Self-pay

## 2021-05-23 ENCOUNTER — Inpatient Hospital Stay: Payer: 59

## 2021-05-23 DIAGNOSIS — N920 Excessive and frequent menstruation with regular cycle: Secondary | ICD-10-CM

## 2021-05-23 DIAGNOSIS — D509 Iron deficiency anemia, unspecified: Secondary | ICD-10-CM | POA: Diagnosis not present

## 2021-05-23 DIAGNOSIS — D693 Immune thrombocytopenic purpura: Secondary | ICD-10-CM | POA: Diagnosis not present

## 2021-05-23 LAB — CBC WITH DIFFERENTIAL/PLATELET
Abs Immature Granulocytes: 0.01 10*3/uL (ref 0.00–0.07)
Basophils Absolute: 0 10*3/uL (ref 0.0–0.1)
Basophils Relative: 1 %
Eosinophils Absolute: 0.1 10*3/uL (ref 0.0–0.5)
Eosinophils Relative: 1 %
HCT: 39.8 % (ref 36.0–46.0)
Hemoglobin: 12.2 g/dL (ref 12.0–15.0)
Immature Granulocytes: 0 %
Lymphocytes Relative: 29 %
Lymphs Abs: 1.4 10*3/uL (ref 0.7–4.0)
MCH: 24.3 pg — ABNORMAL LOW (ref 26.0–34.0)
MCHC: 30.7 g/dL (ref 30.0–36.0)
MCV: 79.1 fL — ABNORMAL LOW (ref 80.0–100.0)
Monocytes Absolute: 0.4 10*3/uL (ref 0.1–1.0)
Monocytes Relative: 8 %
Neutro Abs: 2.9 10*3/uL (ref 1.7–7.7)
Neutrophils Relative %: 61 %
Platelets: 195 10*3/uL (ref 150–400)
RBC: 5.03 MIL/uL (ref 3.87–5.11)
RDW: 13.8 % (ref 11.5–15.5)
WBC: 4.8 10*3/uL (ref 4.0–10.5)
nRBC: 0 % (ref 0.0–0.2)

## 2021-06-12 ENCOUNTER — Encounter: Payer: Self-pay | Admitting: Hematology and Oncology

## 2021-06-15 ENCOUNTER — Other Ambulatory Visit: Payer: 59

## 2021-06-19 ENCOUNTER — Inpatient Hospital Stay: Payer: Medicaid Other

## 2021-06-19 ENCOUNTER — Other Ambulatory Visit: Payer: Self-pay

## 2021-06-19 ENCOUNTER — Telehealth: Payer: Self-pay

## 2021-06-19 ENCOUNTER — Inpatient Hospital Stay: Payer: Medicaid Other | Attending: Hematology and Oncology

## 2021-06-19 DIAGNOSIS — D693 Immune thrombocytopenic purpura: Secondary | ICD-10-CM | POA: Insufficient documentation

## 2021-06-19 DIAGNOSIS — D509 Iron deficiency anemia, unspecified: Secondary | ICD-10-CM | POA: Insufficient documentation

## 2021-06-19 DIAGNOSIS — N920 Excessive and frequent menstruation with regular cycle: Secondary | ICD-10-CM

## 2021-06-19 LAB — CBC WITH DIFFERENTIAL/PLATELET
Abs Immature Granulocytes: 0.01 10*3/uL (ref 0.00–0.07)
Basophils Absolute: 0 10*3/uL (ref 0.0–0.1)
Basophils Relative: 0 %
Eosinophils Absolute: 0.1 10*3/uL (ref 0.0–0.5)
Eosinophils Relative: 1 %
HCT: 38 % (ref 36.0–46.0)
Hemoglobin: 11.7 g/dL — ABNORMAL LOW (ref 12.0–15.0)
Immature Granulocytes: 0 %
Lymphocytes Relative: 24 %
Lymphs Abs: 1.6 10*3/uL (ref 0.7–4.0)
MCH: 24.1 pg — ABNORMAL LOW (ref 26.0–34.0)
MCHC: 30.8 g/dL (ref 30.0–36.0)
MCV: 78.4 fL — ABNORMAL LOW (ref 80.0–100.0)
Monocytes Absolute: 0.5 10*3/uL (ref 0.1–1.0)
Monocytes Relative: 8 %
Neutro Abs: 4.5 10*3/uL (ref 1.7–7.7)
Neutrophils Relative %: 67 %
Platelets: 227 10*3/uL (ref 150–400)
RBC: 4.85 MIL/uL (ref 3.87–5.11)
RDW: 13.5 % (ref 11.5–15.5)
WBC: 6.7 10*3/uL (ref 4.0–10.5)
nRBC: 0 % (ref 0.0–0.2)

## 2021-06-19 NOTE — Telephone Encounter (Signed)
Called and given below message. She verbalized understanding. 

## 2021-06-19 NOTE — Telephone Encounter (Signed)
-----   Message from Heath Lark, MD sent at 06/19/2021 10:14 AM EDT ----- Pls let her know CBC ok, mild anemia but stable I sent LOS for labs, flush and see me in 1 month

## 2021-07-12 ENCOUNTER — Telehealth: Payer: Self-pay | Admitting: Hematology and Oncology

## 2021-07-12 NOTE — Telephone Encounter (Signed)
Rescheduled appointment per patient. Patient is aware.

## 2021-07-23 ENCOUNTER — Ambulatory Visit: Payer: Medicaid Other | Admitting: Hematology and Oncology

## 2021-07-23 ENCOUNTER — Other Ambulatory Visit: Payer: Medicaid Other

## 2021-08-08 ENCOUNTER — Encounter: Payer: Self-pay | Admitting: Hematology and Oncology

## 2021-08-09 ENCOUNTER — Other Ambulatory Visit: Payer: Self-pay

## 2021-08-09 ENCOUNTER — Inpatient Hospital Stay: Payer: Managed Care, Other (non HMO) | Admitting: Hematology and Oncology

## 2021-08-09 ENCOUNTER — Encounter: Payer: Self-pay | Admitting: Hematology and Oncology

## 2021-08-09 ENCOUNTER — Inpatient Hospital Stay: Payer: Managed Care, Other (non HMO) | Attending: Hematology and Oncology

## 2021-08-09 DIAGNOSIS — N920 Excessive and frequent menstruation with regular cycle: Secondary | ICD-10-CM

## 2021-08-09 DIAGNOSIS — D693 Immune thrombocytopenic purpura: Secondary | ICD-10-CM | POA: Insufficient documentation

## 2021-08-09 DIAGNOSIS — D509 Iron deficiency anemia, unspecified: Secondary | ICD-10-CM | POA: Insufficient documentation

## 2021-08-09 DIAGNOSIS — D696 Thrombocytopenia, unspecified: Secondary | ICD-10-CM

## 2021-08-09 DIAGNOSIS — Z95828 Presence of other vascular implants and grafts: Secondary | ICD-10-CM

## 2021-08-09 LAB — CBC WITH DIFFERENTIAL/PLATELET
Abs Immature Granulocytes: 0.01 10*3/uL (ref 0.00–0.07)
Basophils Absolute: 0 10*3/uL (ref 0.0–0.1)
Basophils Relative: 1 %
Eosinophils Absolute: 0.1 10*3/uL (ref 0.0–0.5)
Eosinophils Relative: 1 %
HCT: 37.5 % (ref 36.0–46.0)
Hemoglobin: 11.8 g/dL — ABNORMAL LOW (ref 12.0–15.0)
Immature Granulocytes: 0 %
Lymphocytes Relative: 34 %
Lymphs Abs: 1.4 10*3/uL (ref 0.7–4.0)
MCH: 24.1 pg — ABNORMAL LOW (ref 26.0–34.0)
MCHC: 31.5 g/dL (ref 30.0–36.0)
MCV: 76.7 fL — ABNORMAL LOW (ref 80.0–100.0)
Monocytes Absolute: 0.3 10*3/uL (ref 0.1–1.0)
Monocytes Relative: 8 %
Neutro Abs: 2.3 10*3/uL (ref 1.7–7.7)
Neutrophils Relative %: 56 %
Platelets: 200 10*3/uL (ref 150–400)
RBC: 4.89 MIL/uL (ref 3.87–5.11)
RDW: 13.9 % (ref 11.5–15.5)
WBC: 4.1 10*3/uL (ref 4.0–10.5)
nRBC: 0 % (ref 0.0–0.2)

## 2021-08-09 MED ORDER — HEPARIN SOD (PORK) LOCK FLUSH 100 UNIT/ML IV SOLN
500.0000 [IU] | INTRAVENOUS | Status: AC | PRN
  Administered 2021-08-09: 500 [IU]

## 2021-08-09 MED ORDER — SODIUM CHLORIDE 0.9% FLUSH
10.0000 mL | INTRAVENOUS | Status: AC | PRN
  Administered 2021-08-09: 10 mL

## 2021-08-09 NOTE — Assessment & Plan Note (Signed)
She has no signs of ITP recurrence We discussed future follow-up Due to her recent move and insurance changes, it is not clear whether she can return here for future appointment She need port flush she is to maintain port and appointment in 3 months She will call me with update on her insurance plan and future follow-up

## 2021-08-09 NOTE — Progress Notes (Signed)
[place] OFFICE PROGRESS NOTE  Orr, Stacey Quin, DO  ASSESSMENT & PLAN:  Thrombocytopenia ([place]) She has no signs of ITP recurrence We discussed future follow-up Due to her recent move and insurance changes, it is not clear whether she can return here for future appointment She need port flush she is to maintain port and appointment in 3 months She will call me with update on her insurance plan and future follow-up  Microcytic anemia She has microcytic anemia with borderline iron deficiency in the past She denies recent menorrhagia Her hemoglobin is stable Observe close  No orders of the defined types were placed in this encounter.   The total time spent in the appointment was 20 minutes encounter with patients including review of chart and various tests results, discussions about plan of care and coordination of care plan   All questions were answered. The patient knows to call the clinic with any problems, questions or concerns. No barriers to learning was detected.    Heath Lark, MD 12/1/202212:20 PM  INTERVAL HISTORY: Stacey Orr 35 y.o. 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  White blood cell count 7.6, hemoglobin 12.6 and platelet count of 218 In April, she presented to local emergency room with high-grade fever of 102 Fahrenheit She was subsequently diagnosed with COVID-19 infection and was hospitalized According to results reviewed care everywhere, her platelet count dropped to as low as 30,000 but improved 218,000 upon discharge She denies bleeding complications while hospitalized The patient have heavy menstruation.  Her date of her last menstrual period was on June  27 Since the birth of her son in April 2020, she have been having menorrhagia with regular cycle of every 30 days, typically she would bleed around 7 days She denies recent bruising/bleeding, such as spontaneous epistaxis, hematuria, melena or hematochezia  The patient denies history of liver disease, exposure to heparin, history of cardiac murmur/prior cardiovascular surgery or recent new medications She denies prior blood or platelet transfusions She does not take regular aspirin or NSAID According to scanned report, her referring physician repeated CBC on March 06, 2020 which showed white count of 4.6, hemoglobin 11.9 with MCV of 78 and platelet count of 95,000 She was then referred here for further evaluation She received 2 doses of IV iron in October She had extensive work-up with blood draw for vitamin B12 level, autoimmune testing, hepatitis C, HIV viral antibody, and CT imaging came back normal.  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  She responded well to prednisone therapy  I have reviewed the past medical history, past surgical history, social history and family history with the patient and they are unchanged from previous note.  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  MEDICATIONS:  Current Outpatient Medications  Medication Sig Dispense Refill   lidocaine-prilocaine (EMLA) cream Apply to affected area once 30 g 3   No current facility-administered medications for this visit.     REVIEW OF SYSTEMS:   Constitutional: Denies fevers, chills or  night sweats Eyes: Denies blurriness of vision Ears, nose, mouth, throat, and face: Denies mucositis or sore throat Respiratory: Denies cough, dyspnea or wheezes Cardiovascular: Denies palpitation, chest  discomfort or lower extremity swelling Gastrointestinal:  Denies nausea, heartburn or change in bowel habits Skin: Denies abnormal skin rashes Lymphatics: Denies new lymphadenopathy or easy bruising Neurological:Denies numbness, tingling or new weaknesses Behavioral/Psych: Mood is stable, no new changes  All other systems were reviewed with the patient and are negative.  PHYSICAL EXAMINATION: ECOG PERFORMANCE STATUS: 0 - Asymptomatic  Vitals:   08/09/21 1043  BP: (!) 145/96  Pulse: 92  Resp: 18  Temp: 97.6 F (36.4 C)  SpO2: 100%   Filed Weights   08/09/21 1043  Weight: (!) 318 lb 12.8 oz (144.6 kg)    GENERAL:alert, no distress and comfortable SKIN: skin color, texture, turgor are normal, no rashes or significant lesions EYES: normal, Conjunctiva are pink and non-injected, sclera clear OROPHARYNX:no exudate, no erythema and lips, buccal mucosa, and tongue normal  NECK: supple, thyroid normal size, non-tender, without nodularity LYMPH:  no palpable lymphadenopathy in the cervical, axillary or inguinal LUNGS: clear to auscultation and percussion with normal breathing effort HEART: regular rate & rhythm and no murmurs and no lower extremity edema ABDOMEN:abdomen soft, non-tender and normal bowel sounds Musculoskeletal:no cyanosis of digits and no clubbing  NEURO: alert & oriented x 3 with fluent speech, no focal motor/sensory deficits  LABORATORY DATA:  I have reviewed the data as listed     Component Value Date/Time   NA 140 04/06/2021 0910   K 3.6 04/06/2021 0910   CL 107 04/06/2021 0910   CO2 24 04/06/2021 0910   GLUCOSE 108 (H) 04/06/2021 0910   BUN 9 04/06/2021 0910   CREATININE 0.81 04/06/2021 0910   CREATININE 0.86 02/26/2021 0903   CALCIUM 8.8 (L) 04/06/2021 0910   PROT 7.6 04/06/2021 0910   ALBUMIN 3.4 (L) 04/06/2021 0910   AST 14 (L) 04/06/2021 0910   AST 18 02/26/2021 0903   ALT 12 04/06/2021 0910   ALT 19 02/26/2021 0903   ALKPHOS 79 04/06/2021 0910    BILITOT 0.3 04/06/2021 0910   BILITOT 0.5 02/26/2021 0903   GFRNONAA >60 04/06/2021 0910   GFRNONAA >60 02/26/2021 0903    No results found for: SPEP, UPEP  Lab Results  Component Value Date   WBC 4.1 08/09/2021   NEUTROABS 2.3 08/09/2021   HGB 11.8 (L) 08/09/2021   HCT 37.5 08/09/2021   MCV 76.7 (L) 08/09/2021   PLT 200 08/09/2021      Chemistry      Component Value Date/Time   NA 140 04/06/2021 0910   K 3.6 04/06/2021 0910   CL 107 04/06/2021 0910   CO2 24 04/06/2021 0910   BUN 9 04/06/2021 0910   CREATININE 0.81 04/06/2021 0910   CREATININE 0.86 02/26/2021 0903      Component Value Date/Time   CALCIUM 8.8 (L) 04/06/2021 0910   ALKPHOS 79 04/06/2021 0910   AST 14 (L) 04/06/2021 0910   AST 18 02/26/2021 0903   ALT 12 04/06/2021 0910   ALT 19 02/26/2021 0903   BILITOT 0.3 04/06/2021 0910   BILITOT 0.5 02/26/2021 5003

## 2021-08-09 NOTE — Assessment & Plan Note (Signed)
She has microcytic anemia with borderline iron deficiency in the past She denies recent menorrhagia Her hemoglobin is stable Observe close

## 2021-08-15 ENCOUNTER — Encounter: Payer: Self-pay | Admitting: Hematology and Oncology

## 2021-08-30 ENCOUNTER — Encounter: Payer: Self-pay | Admitting: Hematology and Oncology

## 2021-09-20 ENCOUNTER — Encounter: Payer: Self-pay | Admitting: Hematology and Oncology

## 2021-09-24 ENCOUNTER — Telehealth: Payer: Self-pay | Admitting: Hematology and Oncology

## 2021-09-24 ENCOUNTER — Other Ambulatory Visit: Payer: Self-pay | Admitting: Hematology and Oncology

## 2021-09-24 DIAGNOSIS — D696 Thrombocytopenia, unspecified: Secondary | ICD-10-CM

## 2021-09-24 NOTE — Telephone Encounter (Signed)
Sch per 1/16 inbasket, pt did not answer , left msg

## 2021-10-19 ENCOUNTER — Encounter: Payer: Self-pay | Admitting: Hematology and Oncology

## 2021-10-19 ENCOUNTER — Other Ambulatory Visit: Payer: Self-pay

## 2021-10-19 ENCOUNTER — Inpatient Hospital Stay: Payer: Managed Care, Other (non HMO) | Attending: Hematology and Oncology

## 2021-10-19 ENCOUNTER — Inpatient Hospital Stay (HOSPITAL_BASED_OUTPATIENT_CLINIC_OR_DEPARTMENT_OTHER): Payer: Managed Care, Other (non HMO) | Admitting: Hematology and Oncology

## 2021-10-19 ENCOUNTER — Other Ambulatory Visit (HOSPITAL_COMMUNITY): Payer: Self-pay

## 2021-10-19 DIAGNOSIS — Z862 Personal history of diseases of the blood and blood-forming organs and certain disorders involving the immune mechanism: Secondary | ICD-10-CM | POA: Insufficient documentation

## 2021-10-19 DIAGNOSIS — D693 Immune thrombocytopenic purpura: Secondary | ICD-10-CM

## 2021-10-19 DIAGNOSIS — D509 Iron deficiency anemia, unspecified: Secondary | ICD-10-CM

## 2021-10-19 DIAGNOSIS — D696 Thrombocytopenia, unspecified: Secondary | ICD-10-CM

## 2021-10-19 DIAGNOSIS — Z95828 Presence of other vascular implants and grafts: Secondary | ICD-10-CM

## 2021-10-19 LAB — CBC WITH DIFFERENTIAL/PLATELET
Abs Immature Granulocytes: 0.02 10*3/uL (ref 0.00–0.07)
Basophils Absolute: 0 10*3/uL (ref 0.0–0.1)
Basophils Relative: 0 %
Eosinophils Absolute: 0.1 10*3/uL (ref 0.0–0.5)
Eosinophils Relative: 1 %
HCT: 37.7 % (ref 36.0–46.0)
Hemoglobin: 11.7 g/dL — ABNORMAL LOW (ref 12.0–15.0)
Immature Granulocytes: 0 %
Lymphocytes Relative: 30 %
Lymphs Abs: 1.9 10*3/uL (ref 0.7–4.0)
MCH: 24.2 pg — ABNORMAL LOW (ref 26.0–34.0)
MCHC: 31 g/dL (ref 30.0–36.0)
MCV: 77.9 fL — ABNORMAL LOW (ref 80.0–100.0)
Monocytes Absolute: 0.4 10*3/uL (ref 0.1–1.0)
Monocytes Relative: 7 %
Neutro Abs: 3.8 10*3/uL (ref 1.7–7.7)
Neutrophils Relative %: 62 %
Platelets: 274 10*3/uL (ref 150–400)
RBC: 4.84 MIL/uL (ref 3.87–5.11)
RDW: 14.1 % (ref 11.5–15.5)
WBC: 6.3 10*3/uL (ref 4.0–10.5)
nRBC: 0 % (ref 0.0–0.2)

## 2021-10-19 LAB — COMPREHENSIVE METABOLIC PANEL
ALT: 15 U/L (ref 0–44)
AST: 16 U/L (ref 15–41)
Albumin: 3.7 g/dL (ref 3.5–5.0)
Alkaline Phosphatase: 69 U/L (ref 38–126)
Anion gap: 5 (ref 5–15)
BUN: 8 mg/dL (ref 6–20)
CO2: 27 mmol/L (ref 22–32)
Calcium: 8.8 mg/dL — ABNORMAL LOW (ref 8.9–10.3)
Chloride: 105 mmol/L (ref 98–111)
Creatinine, Ser: 0.71 mg/dL (ref 0.44–1.00)
GFR, Estimated: >60 mL/min (ref >60)
Glucose, Bld: 95 mg/dL (ref 70–99)
Potassium: 4 mmol/L (ref 3.5–5.1)
Sodium: 137 mmol/L (ref 135–145)
Total Bilirubin: 0.3 mg/dL (ref 0.3–1.2)
Total Protein: 7.7 g/dL (ref 6.5–8.1)

## 2021-10-19 MED ORDER — SODIUM CHLORIDE 0.9% FLUSH
10.0000 mL | INTRAVENOUS | Status: DC | PRN
  Administered 2021-10-19: 10 mL via INTRAVENOUS

## 2021-10-19 MED ORDER — PHENTERMINE HCL 30 MG PO CAPS
ORAL_CAPSULE | ORAL | 0 refills | Status: AC
  Filled 2021-10-19: qty 30, 30d supply, fill #0

## 2021-10-19 MED ORDER — HEPARIN SOD (PORK) LOCK FLUSH 100 UNIT/ML IV SOLN
500.0000 [IU] | Freq: Once | INTRAVENOUS | Status: AC
  Administered 2021-10-19: 500 [IU] via INTRAVENOUS

## 2021-10-19 NOTE — Assessment & Plan Note (Signed)
She has lost a lot of weight due to dietary modification and medical management I encouraged the patient to continue her weight loss journey

## 2021-10-19 NOTE — Progress Notes (Signed)
[place] OFFICE PROGRESS NOTE  Banga, [name], DO  ASSESSMENT & PLAN:  Acute ITP ([place]) Her ITP has resolved and she has not needed treatment for a while We discussed the risk and benefits of port removal I will see her again in 2 months for further follow-up and if she has no clinical signs of relapse, we will get it removed She is in agreement  Microcytic anemia She has microcytic anemia with borderline iron deficiency in the past She denies recent menorrhagia Her hemoglobin is stable Observe closely  Obesity, Class III, BMI 40-49.9 (morbid obesity) ([place]) She has lost a lot of weight due to dietary modification and medical management I encouraged the patient to continue her weight loss journey  No orders of the defined types were placed in this encounter.   The total time spent in the appointment was 20 minutes encounter with patients including review of chart and various tests results, discussions about plan of care and coordination of care plan   All questions were answered. The patient knows to call the clinic with any problems, questions or concerns. No barriers to learning was detected.    Heath Lark, MD 2/10/202310:39 AM  INTERVAL HISTORY: Stacey Orr 36 y.o. female returns for follow-up on microcytic anemia and history of recurrent ITP She is doing well No recent infection or bleeding complications She denies recent menorrhagia She has lost a lot of weight She attended a weight loss clinic and was prescribed phentermine that seems to help her to lose weight She is also choosing healthier food choices and started to exercise  SUMMARY OF HEMATOLOGIC HISTORY:   She was found to have abnormal CBC from recent blood draw Her baseline blood count from December 10, 2018 was normal.  White blood cell count 7.6, hemoglobin 12.6 and platelet count of 218 In April, she presented to local emergency room with high-grade fever of 102 Fahrenheit She  was subsequently diagnosed with COVID-19 infection and was hospitalized According to results reviewed care everywhere, her platelet count dropped to as low as 30,000 but improved 218,000 upon discharge She denies bleeding complications while hospitalized The patient have heavy menstruation.  Her date of her last menstrual period was on June 27 Since the birth of her son in April 2020, she have been having menorrhagia with regular cycle of every 30 days, typically she would bleed around 7 days She denies recent bruising/bleeding, such as spontaneous epistaxis, hematuria, melena or hematochezia  The patient denies history of liver disease, exposure to heparin, history of cardiac murmur/prior cardiovascular surgery or recent new medications She denies prior blood or platelet transfusions She does not take regular aspirin or NSAID According to scanned report, her referring physician repeated CBC on March 06, 2020 which showed white count of 4.6, hemoglobin 11.9 with MCV of 78 and platelet count of 95,000 She was then referred here for further evaluation She received 2 doses of IV iron in October She had extensive work-up with blood draw for vitamin B12 level, autoimmune testing, hepatitis C, HIV viral antibody, and CT imaging came back normal.  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  She responded well to prednisone therapy  I have reviewed the past medical history, past surgical history, social history and family history with the patient and  they are unchanged from previous note.  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  MEDICATIONS:  Current Outpatient Medications  Medication Sig Dispense Refill   lidocaine-prilocaine (EMLA) cream Apply to affected area once 30 g 3    phentermine 30 MG capsule Take 1 capsule by mouth every morning with breakfast 30 capsule 0   No current facility-administered medications for this visit.     REVIEW OF SYSTEMS:   Constitutional: Denies fevers, chills or night sweats Eyes: Denies blurriness of vision Ears, nose, mouth, throat, and face: Denies mucositis or sore throat Respiratory: Denies cough, dyspnea or wheezes Cardiovascular: Denies palpitation, chest discomfort or lower extremity swelling Gastrointestinal:  Denies nausea, heartburn or change in bowel habits Skin: Denies abnormal skin rashes Lymphatics: Denies new lymphadenopathy or easy bruising Neurological:Denies numbness, tingling or new weaknesses Behavioral/Psych: Mood is stable, no new changes  All other systems were reviewed with the patient and are negative.  PHYSICAL EXAMINATION: ECOG PERFORMANCE STATUS: 0 - Asymptomatic  Vitals:   10/19/21 1031  BP: (!) 132/49  Pulse: 87  Resp: 18  Temp: 98 F (36.7 C)  SpO2: 100%   Filed Weights   10/19/21 1031  Weight: (!) 307 lb 9.6 oz (139.5 kg)    GENERAL:alert, no distress and comfortable NEURO: alert & oriented x 3 with fluent speech, no focal motor/sensory deficits  LABORATORY DATA:  I have reviewed the data as listed     Component Value Date/Time   NA 140 04/06/2021 0910   K 3.6 04/06/2021 0910   CL 107 04/06/2021 0910   CO2 24 04/06/2021 0910   GLUCOSE 108 (H) 04/06/2021 0910   BUN 9 04/06/2021 0910   CREATININE 0.81 04/06/2021 0910   CREATININE 0.86 02/26/2021 0903   CALCIUM 8.8 (L) 04/06/2021 0910   PROT 7.6 04/06/2021 0910   ALBUMIN 3.4 (L) 04/06/2021 0910   AST 14 (L) 04/06/2021 0910   AST 18 02/26/2021 0903   ALT 12 04/06/2021 0910   ALT 19 02/26/2021 0903   ALKPHOS 79 04/06/2021 0910   BILITOT 0.3 04/06/2021 0910   BILITOT 0.5 02/26/2021 0903   GFRNONAA >60 04/06/2021 0910   GFRNONAA >60 02/26/2021 0903    No results found for: SPEP, UPEP  Lab Results  Component Value  Date   WBC 6.3 10/19/2021   NEUTROABS 3.8 10/19/2021   HGB 11.7 (L) 10/19/2021   HCT 37.7 10/19/2021   MCV 77.9 (L) 10/19/2021   PLT 274 10/19/2021      Chemistry      Component Value Date/Time   NA 140 04/06/2021 0910   K 3.6 04/06/2021 0910   CL 107 04/06/2021 0910   CO2 24 04/06/2021 0910   BUN 9 04/06/2021 0910   CREATININE 0.81 04/06/2021 0910   CREATININE 0.86 02/26/2021 0903      Component Value Date/Time   CALCIUM 8.8 (L) 04/06/2021 0910   ALKPHOS 79 04/06/2021 0910   AST 14 (L) 04/06/2021 0910   AST 18 02/26/2021 0903   ALT 12 04/06/2021 0910   ALT 19 02/26/2021 0903   BILITOT 0.3 04/06/2021 0910   BILITOT 0.5 02/26/2021 5009

## 2021-10-19 NOTE — Assessment & Plan Note (Signed)
Her ITP has resolved and she has not needed treatment for a while We discussed the risk and benefits of port removal I will see her again in 2 months for further follow-up and if she has no clinical signs of relapse, we will get it removed She is in agreement

## 2021-10-19 NOTE — Assessment & Plan Note (Signed)
She has microcytic anemia with borderline iron deficiency in the past She denies recent menorrhagia Her hemoglobin is stable Observe closely

## 2021-12-21 ENCOUNTER — Inpatient Hospital Stay (HOSPITAL_BASED_OUTPATIENT_CLINIC_OR_DEPARTMENT_OTHER): Payer: Managed Care, Other (non HMO) | Admitting: Hematology and Oncology

## 2021-12-21 ENCOUNTER — Inpatient Hospital Stay: Payer: Managed Care, Other (non HMO) | Attending: Hematology and Oncology

## 2021-12-21 ENCOUNTER — Other Ambulatory Visit: Payer: Self-pay

## 2021-12-21 ENCOUNTER — Encounter: Payer: Self-pay | Admitting: Hematology and Oncology

## 2021-12-21 VITALS — BP 131/84 | HR 96 | Temp 97.8°F | Resp 16 | Ht 65.0 in | Wt 302.0 lb

## 2021-12-21 DIAGNOSIS — Z95828 Presence of other vascular implants and grafts: Secondary | ICD-10-CM

## 2021-12-21 DIAGNOSIS — D509 Iron deficiency anemia, unspecified: Secondary | ICD-10-CM

## 2021-12-21 DIAGNOSIS — D696 Thrombocytopenia, unspecified: Secondary | ICD-10-CM

## 2021-12-21 DIAGNOSIS — R634 Abnormal weight loss: Secondary | ICD-10-CM | POA: Insufficient documentation

## 2021-12-21 LAB — COMPREHENSIVE METABOLIC PANEL
ALT: 14 U/L (ref 0–44)
AST: 15 U/L (ref 15–41)
Albumin: 3.7 g/dL (ref 3.5–5.0)
Alkaline Phosphatase: 66 U/L (ref 38–126)
Anion gap: 4 — ABNORMAL LOW (ref 5–15)
BUN: 9 mg/dL (ref 6–20)
CO2: 27 mmol/L (ref 22–32)
Calcium: 8.6 mg/dL — ABNORMAL LOW (ref 8.9–10.3)
Chloride: 106 mmol/L (ref 98–111)
Creatinine, Ser: 0.72 mg/dL (ref 0.44–1.00)
GFR, Estimated: >60 mL/min (ref >60)
Glucose, Bld: 101 mg/dL — ABNORMAL HIGH (ref 70–99)
Potassium: 3.4 mmol/L — ABNORMAL LOW (ref 3.5–5.1)
Sodium: 137 mmol/L (ref 135–145)
Total Bilirubin: 0.3 mg/dL (ref 0.3–1.2)
Total Protein: 7.6 g/dL (ref 6.5–8.1)

## 2021-12-21 LAB — CBC WITH DIFFERENTIAL/PLATELET
Abs Immature Granulocytes: 0 10*3/uL (ref 0.00–0.07)
Basophils Absolute: 0 10*3/uL (ref 0.0–0.1)
Basophils Relative: 1 %
Eosinophils Absolute: 0 10*3/uL (ref 0.0–0.5)
Eosinophils Relative: 1 %
HCT: 36.7 % (ref 36.0–46.0)
Hemoglobin: 11.4 g/dL — ABNORMAL LOW (ref 12.0–15.0)
Immature Granulocytes: 0 %
Lymphocytes Relative: 34 %
Lymphs Abs: 1.3 10*3/uL (ref 0.7–4.0)
MCH: 24.3 pg — ABNORMAL LOW (ref 26.0–34.0)
MCHC: 31.1 g/dL (ref 30.0–36.0)
MCV: 78.3 fL — ABNORMAL LOW (ref 80.0–100.0)
Monocytes Absolute: 0.3 10*3/uL (ref 0.1–1.0)
Monocytes Relative: 9 %
Neutro Abs: 2.1 10*3/uL (ref 1.7–7.7)
Neutrophils Relative %: 55 %
Platelets: 180 10*3/uL (ref 150–400)
RBC: 4.69 MIL/uL (ref 3.87–5.11)
RDW: 14.2 % (ref 11.5–15.5)
WBC: 3.8 10*3/uL — ABNORMAL LOW (ref 4.0–10.5)
nRBC: 0 % (ref 0.0–0.2)

## 2021-12-21 MED ORDER — HEPARIN SOD (PORK) LOCK FLUSH 100 UNIT/ML IV SOLN
500.0000 [IU] | INTRAVENOUS | Status: AC | PRN
  Administered 2021-12-21: 500 [IU]

## 2021-12-21 MED ORDER — SODIUM CHLORIDE 0.9% FLUSH
10.0000 mL | INTRAVENOUS | Status: AC | PRN
  Administered 2021-12-21: 10 mL

## 2021-12-21 NOTE — Assessment & Plan Note (Signed)
She has history of ITP, resolved ?She never received rituximab as her insurance will not approve ?Due to stability of her blood count, I recommend port removal and she is in agreement ?I recommend close monitoring and follow-up with her primary care doctor ?

## 2021-12-21 NOTE — Assessment & Plan Note (Signed)
She has lost a lot of weight due to dietary modification and medical management ?I encouraged the patient to continue her weight loss journey ?

## 2021-12-21 NOTE — Assessment & Plan Note (Signed)
She has microcytic anemia with borderline iron deficiency in the past ?She denies recent menorrhagia ?Her hemoglobin is stable ?Observe closely ?

## 2021-12-21 NOTE — Progress Notes (Signed)
? ?Stacey Orr ?OFFICE PROGRESS NOTE ? ?Stacey Hay, DO ? ?ASSESSMENT & PLAN:  ?Thrombocytopenia ([place]) ?She has history of ITP, resolved ?She never received rituximab as her insurance will not approve ?Due to stability of her blood count, I recommend port removal and she is in agreement ?I recommend close monitoring and follow-up with her primary care doctor ? ?Microcytic anemia ?She has microcytic anemia with borderline iron deficiency in the past ?She denies recent menorrhagia ?Her hemoglobin is stable ?Observe closely ? ?Obesity, Class III, BMI 40-49.9 (morbid obesity) ([place]) ?She has lost a lot of weight due to dietary modification and medical management ?I encouraged the patient to continue her weight loss journey ? ?Orders Placed This Encounter  ?Procedures  ? IR REMOVAL TUN ACCESS W/ PORT W/O FL MOD SED  ?  Standing Status:   Future  ?  Standing Expiration Date:   12/22/2022  ?  Order Specific Question:   Reason for exam:  ?  Answer:   no need port  ?  Order Specific Question:   Is the patient pregnant?  ?  Answer:   No  ?  Order Specific Question:   Preferred Imaging Location?  ?  Answer:   [facility]  ? ? ?The total time spent in the appointment was 20 minutes encounter with patients including review of chart and various tests results, discussions about plan of care and coordination of care plan ? ? All questions were answered. The patient knows to call the clinic with any problems, questions or concerns. No barriers to learning was detected. ? ? ? Stacey Lark, MD ?4/14/202311:06 AM ? ?INTERVAL HISTORY: ?Stacey Orr 36 y.o. female returns for follow-up for history of menorrhagia and ITP ?Her ITP was triggered by COVID ?She has occasional anemia related to her menstrual cycle ?She is doing well ?No recent infection ?Denies excessive heavy menstruation ?She continues to lose weight with each visit ? ?SUMMARY OF HEMATOLOGIC HISTORY: ?She was found to have abnormal CBC from  recent blood draw ?Her baseline blood count from December 10, 2018 was normal.  White blood cell count 7.6, hemoglobin 12.6 and platelet count of 218 ?In April, she presented to local emergency room with high-grade fever of 102 Fahrenheit ?She was subsequently diagnosed with COVID-19 infection and was hospitalized ?According to results reviewed care everywhere, her platelet count dropped to as low as 30,000 but improved 218,000 upon discharge ?She denies bleeding complications while hospitalized ?The patient have heavy menstruation.  Her date of her last menstrual period was on June 27 ?Since the birth of her son in April 2020, she have been having menorrhagia with regular cycle of every 30 days, typically she would bleed around 7 days ?She denies recent bruising/bleeding, such as spontaneous epistaxis, hematuria, melena or hematochezia ? ?The patient denies history of liver disease, exposure to heparin, history of cardiac murmur/prior cardiovascular surgery or recent new medications ?She denies prior blood or platelet transfusions ?She does not take regular aspirin or NSAID ?According to scanned report, her referring physician repeated CBC on March 06, 2020 which showed white count of 4.6, hemoglobin 11.9 with MCV of 78 and platelet count of 95,000 ?She was then referred here for further evaluation ?She received 2 doses of IV iron in October ?She had extensive work-up with blood draw for vitamin B12 level, autoimmune testing, hepatitis C, HIV viral antibody, and CT imaging came back normal.  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  She responded well to prednisone therapy ? ?I have reviewed the past medical history, past surgical history, social history and family history with the patient and they are  unchanged from previous note. ? 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 ? ?MEDICATIONS:  ?Current Outpatient Medications  ?Medication Sig Dispense Refill  ? phentermine 30 MG capsule Take 1 capsule by mouth every morning with breakfast 30 capsule 0  ? ?No current facility-administered medications for this visit.  ?  ? ?REVIEW OF SYSTEMS:   ?Constitutional: Denies fevers, chills or night sweats ?Eyes: Denies blurriness of vision ?Ears, nose, mouth, throat, and face: Denies mucositis or sore throat ?Respiratory: Denies cough, dyspnea or wheezes ?Cardiovascular: Denies palpitation, chest discomfort or lower extremity swelling ?Gastrointestinal:  Denies nausea, heartburn or change in bowel habits ?Skin: Denies abnormal skin rashes ?Lymphatics: Denies new lymphadenopathy or easy bruising ?Neurological:Denies numbness, tingling or new weaknesses ?Behavioral/Psych: Mood is stable, no new changes  ?All other systems were reviewed with the patient and are negative. ? ?PHYSICAL EXAMINATION: ?ECOG PERFORMANCE STATUS: 0 - Asymptomatic ? ?Vitals:  ? 12/21/21 0938  ?BP: 131/84  ?Pulse: 96  ?Resp: 16  ?Temp: 97.8 ?F (36.6 ?C)  ?SpO2: 100%  ? ?Filed Weights  ? 12/21/21 0938  ?Weight: (!) 302 lb (137 kg)  ? ? ?GENERAL:alert, no distress and comfortable ?NEURO: alert & oriented x 3 with fluent speech, no focal motor/sensory deficits ? ?LABORATORY DATA:  ?I have reviewed the data as listed ? ?   ?Component Value Date/Time  ? NA 137 12/21/2021 0936  ? K 3.4 (L) 12/21/2021 0936  ? CL 106 12/21/2021 0936  ? CO2 27 12/21/2021 0936  ? GLUCOSE 101 (H) 12/21/2021 0936  ? BUN 9 12/21/2021 0936  ? CREATININE 0.72 12/21/2021 0936  ? CREATININE 0.86 02/26/2021 0903  ? CALCIUM 8.6 (L) 12/21/2021 0936  ? PROT 7.6 12/21/2021 0936  ? ALBUMIN 3.7 12/21/2021 0936  ? AST 15 12/21/2021 0936  ? AST 18 02/26/2021 0903  ? ALT 14 12/21/2021 0936  ? ALT 19 02/26/2021 0903  ? ALKPHOS 66 12/21/2021 0936  ? BILITOT 0.3 12/21/2021 0936  ? BILITOT 0.5  02/26/2021 0903  ? GFRNONAA >60 12/21/2021 0936  ? GFRNONAA >60 02/26/2021 0903  ? ? ?No results found for: SPEP, UPEP ? ?Lab Results  ?Component Value Date  ? WBC 3.8 (L) 12/21/2021  ? NEUTROABS 2.1 12/21/2021  ? HGB 11.4 (L) 12/21/2021  ? HCT 36.7 12/21/2021  ? MCV 78.3 (L) 12/21/2021  ? PLT 180 12/21/2021  ? ? ?  Chemistry   ?   ?Component Value Date/Time  ? NA 137 12/21/2021 0936  ? K 3.4 (L) 12/21/2021 0936  ? CL 106 12/21/2021 0936  ? CO2 27 12/21/2021 0936  ? BUN 9 12/21/2021 0936  ? CREATININE 0.72 12/21/2021 0936  ? CREATININE 0.86 02/26/2021 0903  ?    ?Component Value Date/Time  ? CALCIUM 8.6 (L) 12/21/2021 0936  ? ALKPHOS 66 12/21/2021 0936  ? AST 15 12/21/2021 0936  ? AST 18 02/26/2021 0903  ? ALT 14 12/21/2021 0936  ? ALT 19 02/26/2021 0903  ? BILITOT 0.3 12/21/2021 0936  ? BILITOT 0.5 02/26/2021 7116  ?  ? ? ?

## 2022-01-04 ENCOUNTER — Ambulatory Visit (HOSPITAL_COMMUNITY)
Admission: RE | Admit: 2022-01-04 | Discharge: 2022-01-04 | Disposition: A | Discharge status: Home / Self Care | Payer: Managed Care, Other (non HMO) | Source: Ambulatory Visit | Attending: Hematology and Oncology | Admitting: Hematology and Oncology

## 2022-01-04 DIAGNOSIS — Z452 Encounter for adjustment and management of vascular access device: Secondary | ICD-10-CM | POA: Insufficient documentation

## 2022-01-04 DIAGNOSIS — Z95828 Presence of other vascular implants and grafts: Secondary | ICD-10-CM

## 2022-01-04 HISTORY — PX: IR REMOVAL TUN ACCESS W/ PORT W/O FL MOD SED: IMG2290

## 2022-01-04 MED ORDER — LIDOCAINE-EPINEPHRINE 1 %-1:100000 IJ SOLN
INTRAMUSCULAR | Status: AC
  Administered 2022-01-04: 10 mL via INTRADERMAL
  Filled 2022-01-04: qty 1

## 2022-04-17 ENCOUNTER — Encounter (INDEPENDENT_AMBULATORY_CARE_PROVIDER_SITE_OTHER): Payer: Self-pay

## 2022-10-01 IMAGING — CT CT CHEST-ABD-PELV W/ CM
2 of 4 series · 13 of 36 positions shown, 18 images · IV contrast (omnipaque)
Comparison: None.

CLINICAL DATA: Lymphadenopathy in the neck. COVID positive early
this year resulting in hospitalization for 3 days. Abnormal labs.
Thrombocytopenia.

EXAM:
CT CHEST, ABDOMEN, AND PELVIS WITH CONTRAST
TECHNIQUE: Multidetector CT imaging of the chest, abdomen and pelvis was
performed following the standard protocol during bolus
administration of intravenous contrast.
CONTRAST:  100mL OMNIPAQUE IOHEXOL 300 MG/ML  SOLN

[Series 2: cap with 2 · axial · 0.80mm/px · z∈[+684,+1224]mm · 10 of 128 slices shown, 15 images]
[im 10/128  mediastinal]
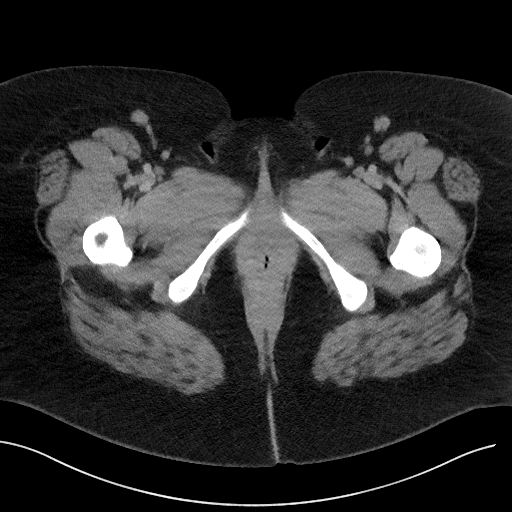
[im 10/128  bone]
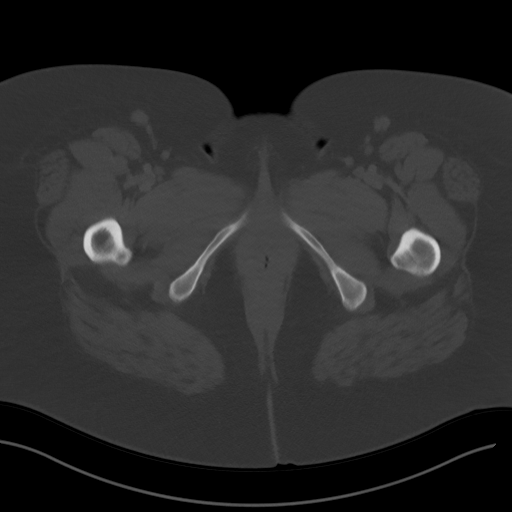
[im 30/128  mediastinal]
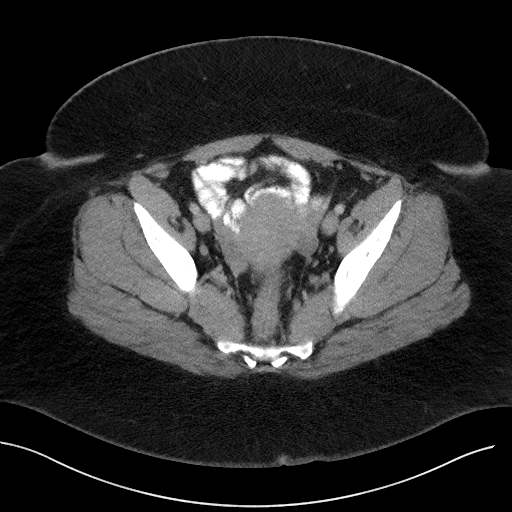
[im 40/128  mediastinal]
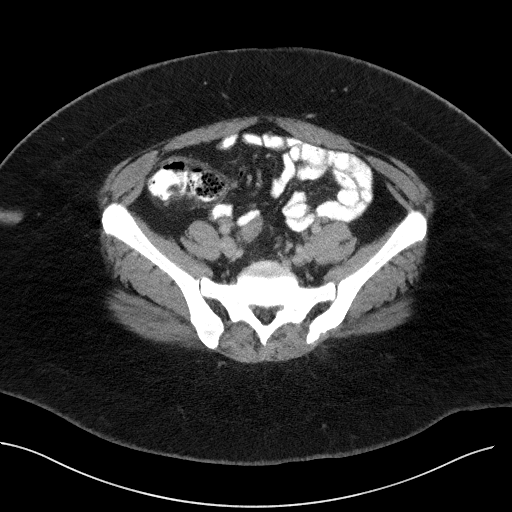
[im 49/128  mediastinal]
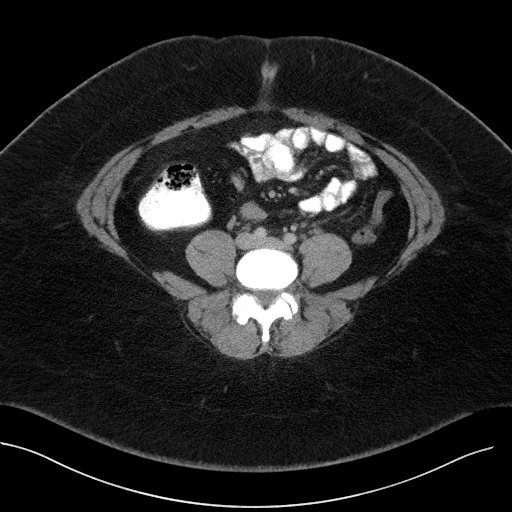
[im 69/128  mediastinal]
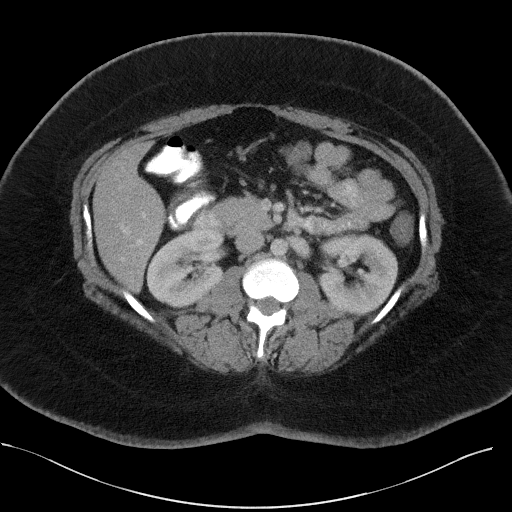
[im 79/128  mediastinal]
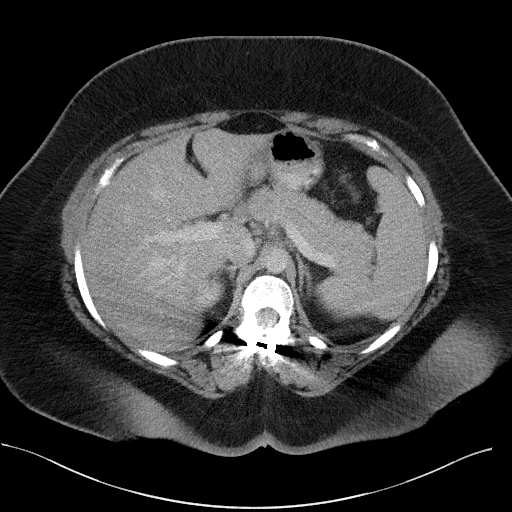
[im 88/128  mediastinal]
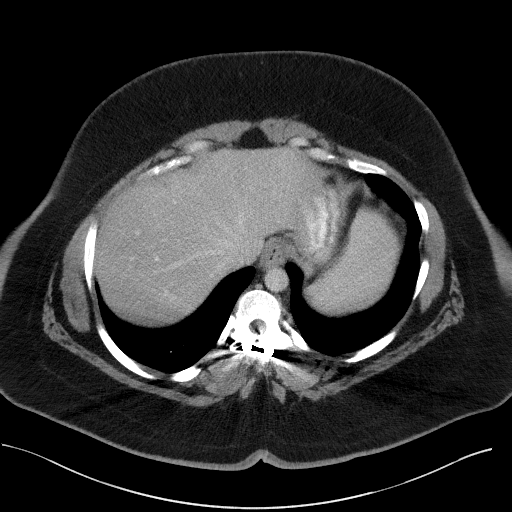
[im 88/128  lung]
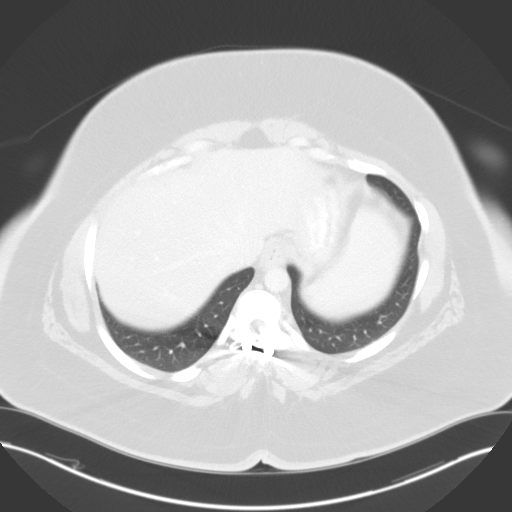
[im 98/128  lung]
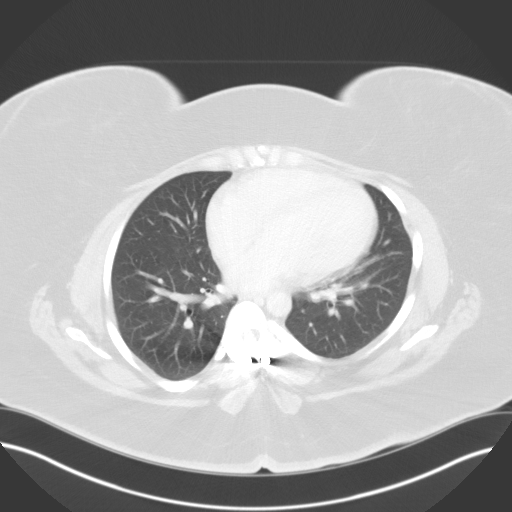
[im 108/128  mediastinal]
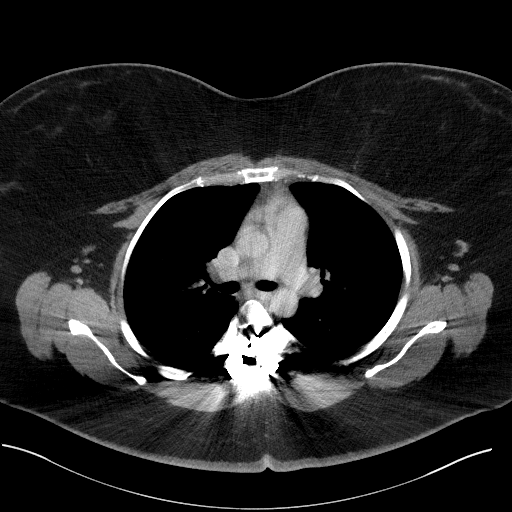
[im 108/128  lung]
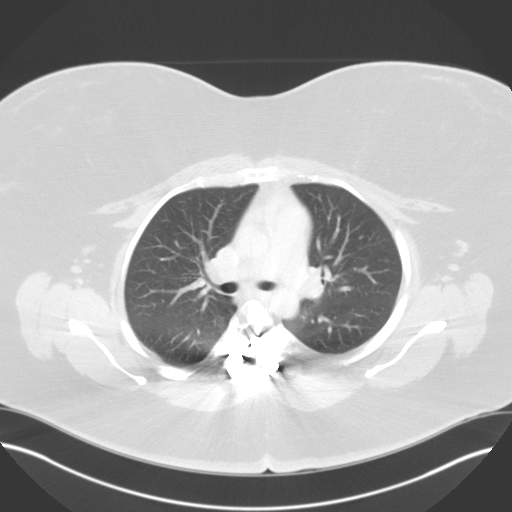
[im 118/128  mediastinal]
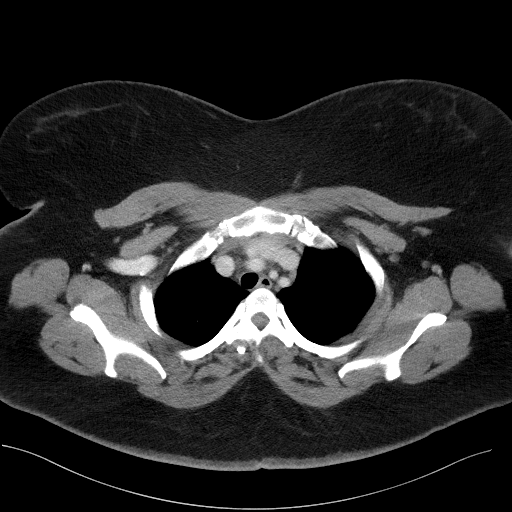
[im 118/128  lung]
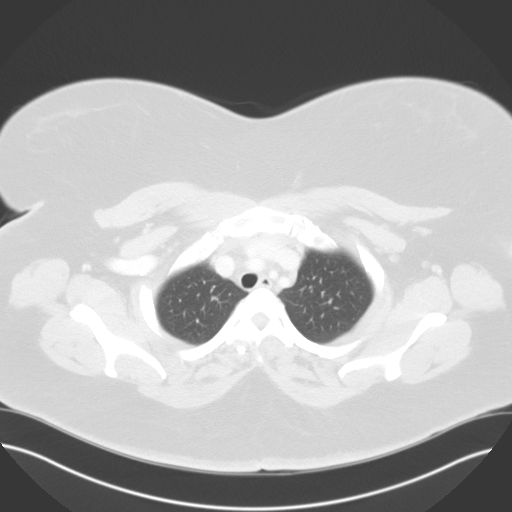
[im 118/128  bone]
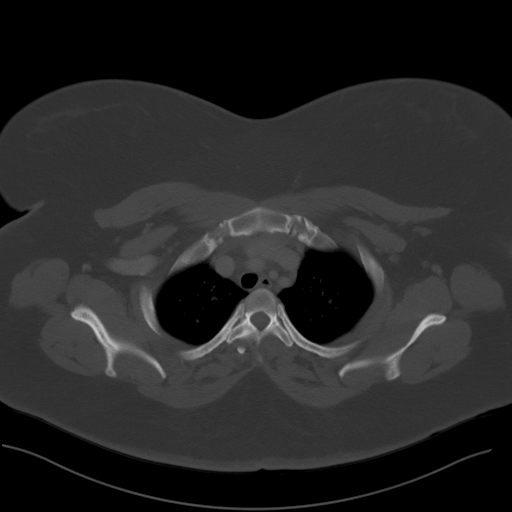

[Series 5: coronals · coronal · 1.11mm/px · 3 of 184 slices shown]
[im 37/184  mediastinal]
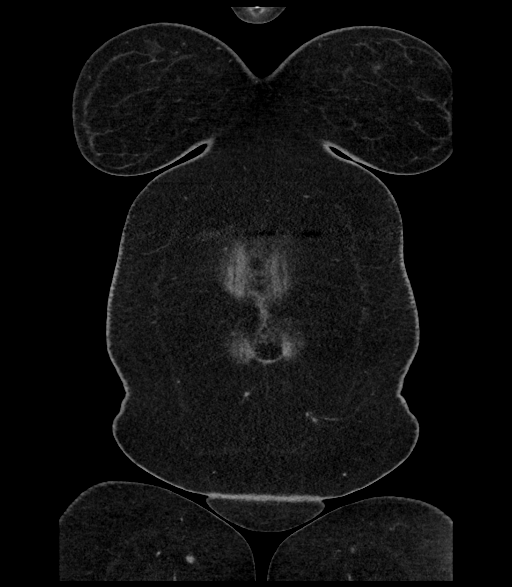
[im 74/184  mediastinal]
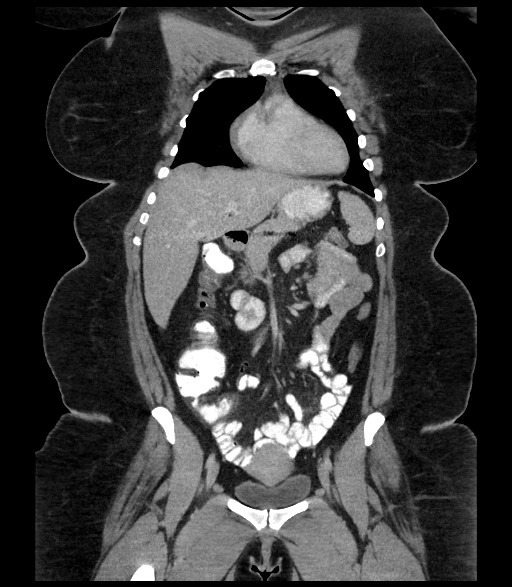
[im 110/184  mediastinal]
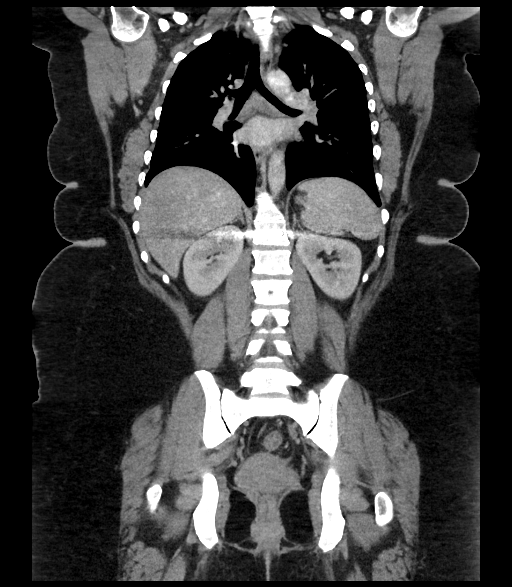

[13 of 36 positions shown; findings below may reference images not displayed]

FINDINGS: CT CHEST FINDINGS

Cardiovascular: Normal aortic caliber. Borderline cardiomegaly,
without pericardial effusion.

Mediastinum/Nodes: No supraclavicular adenopathy. Small bilateral
axillary nodes which maintain their fatty hila.

No mediastinal or hilar adenopathy. Minimal residual thymic tissue
in the anterior mediastinum.

Lungs/Pleura: No pleural fluid.  Clear lungs.

Musculoskeletal: Thoracic spine fixation with minimal convex right
residual thoracic spine curvature.

CT ABDOMEN PELVIS FINDINGS

Hepatobiliary: Mild hepatomegaly at 19.9 cm craniocaudal. No focal
liver lesion. Cholecystectomy, without biliary ductal dilatation.

Pancreas: Mild artifact degradation in the lower chest and upper
abdomen secondary to beam hardening artifact from thoracic spine
fixation hardware.

Normal, without mass or ductal dilatation.

Spleen: Splenic length of 6.6 cm craniocaudal. Splenic volume of
12.6 x 6.4 x 6.6 cm (volume = 280 cm^3).

Adrenals/Urinary Tract: Normal adrenal glands. Normal kidneys,
without hydronephrosis. Normal urinary bladder.

Stomach/Bowel: Normal stomach, without wall thickening. Portions of
the colon appear thick walled, favored to be due to underdistention.
Example in the transverse colon on 69/2. Normal terminal ileum and
appendix. Normal small bowel.

Vascular/Lymphatic: Normal caliber of the aorta and branch vessels.
No abdominal adenopathy. Mildly prominent bilateral inguinal nodes
are favored to be reactive and are typical in the setting of
obesity. Example left inguinal node of 1.5 cm on 124/2.

Reproductive: Normal uterus and adnexa.

Other: No significant free fluid.

Musculoskeletal: Degenerate sclerosis of both sacroiliac joints
IMPRESSION: 1. No splenomegaly or lymphadenopathy to suggest lymphoma.
2. Portions of the colon appears thick walled, favored to be due to
underdistention. Correlate with any symptoms of colitis.
3. Mild limitations secondary to beam hardening artifact from
thoracic spine fixation.

## 2022-10-07 ENCOUNTER — Encounter: Payer: Self-pay | Admitting: Hematology and Oncology

## 2022-10-10 ENCOUNTER — Encounter: Payer: Self-pay | Admitting: Hematology and Oncology

## 2022-10-10 ENCOUNTER — Other Ambulatory Visit: Payer: Self-pay | Admitting: Hematology and Oncology

## 2022-10-10 ENCOUNTER — Telehealth: Payer: Self-pay

## 2022-10-10 DIAGNOSIS — D696 Thrombocytopenia, unspecified: Secondary | ICD-10-CM

## 2022-10-10 NOTE — Telephone Encounter (Signed)
Called and scheduled lab appt on 2/5 at 0915, per her request. She is aware of appt.

## 2022-10-14 ENCOUNTER — Inpatient Hospital Stay: Payer: Managed Care, Other (non HMO) | Attending: Hematology and Oncology

## 2022-10-14 ENCOUNTER — Other Ambulatory Visit: Payer: Self-pay

## 2022-10-14 DIAGNOSIS — D696 Thrombocytopenia, unspecified: Secondary | ICD-10-CM

## 2022-10-14 DIAGNOSIS — D693 Immune thrombocytopenic purpura: Secondary | ICD-10-CM | POA: Diagnosis present

## 2022-10-14 LAB — CBC WITH DIFFERENTIAL/PLATELET
Abs Immature Granulocytes: 0.01 10*3/uL (ref 0.00–0.07)
Basophils Absolute: 0 10*3/uL (ref 0.0–0.1)
Basophils Relative: 1 %
Eosinophils Absolute: 0.1 10*3/uL (ref 0.0–0.5)
Eosinophils Relative: 2 %
HCT: 36.9 % (ref 36.0–46.0)
Hemoglobin: 11.2 g/dL — ABNORMAL LOW (ref 12.0–15.0)
Immature Granulocytes: 0 %
Lymphocytes Relative: 35 %
Lymphs Abs: 1.7 10*3/uL (ref 0.7–4.0)
MCH: 22.6 pg — ABNORMAL LOW (ref 26.0–34.0)
MCHC: 30.4 g/dL (ref 30.0–36.0)
MCV: 74.5 fL — ABNORMAL LOW (ref 80.0–100.0)
Monocytes Absolute: 0.5 10*3/uL (ref 0.1–1.0)
Monocytes Relative: 10 %
Neutro Abs: 2.6 10*3/uL (ref 1.7–7.7)
Neutrophils Relative %: 52 %
Platelets: 70 10*3/uL — ABNORMAL LOW (ref 150–400)
RBC: 4.95 MIL/uL (ref 3.87–5.11)
RDW: 15.4 % (ref 11.5–15.5)
WBC: 5 10*3/uL (ref 4.0–10.5)
nRBC: 0 % (ref 0.0–0.2)

## 2023-04-09 IMAGING — XA IR IMAGING GUIDED PORT INSERTION
2 series · 2 of 2 positions shown · IV contrast (CARBON DIOXIDE)
Comparison: None.

INDICATION: 35-year-old with ITP.  Port-A-Cath needed for treatment.

EXAM:
FLUOROSCOPIC AND ULTRASOUND GUIDED PLACEMENT OF A SUBCUTANEOUS PORT

[Series 1: co2 evenflow · 1 of 1 slices shown]
[im 1/1]
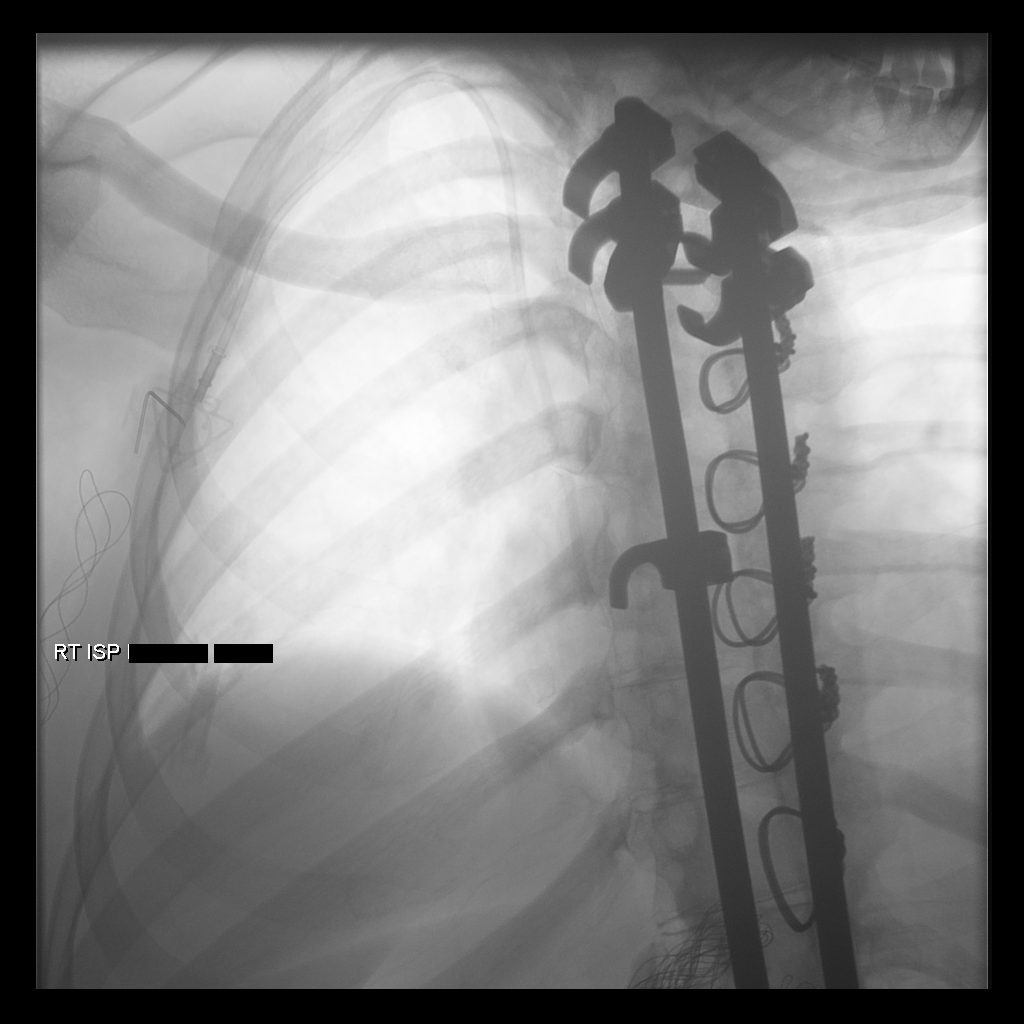

[Series 3: fl (-) angio · 1 of 1 slices shown]
[im 1/1]
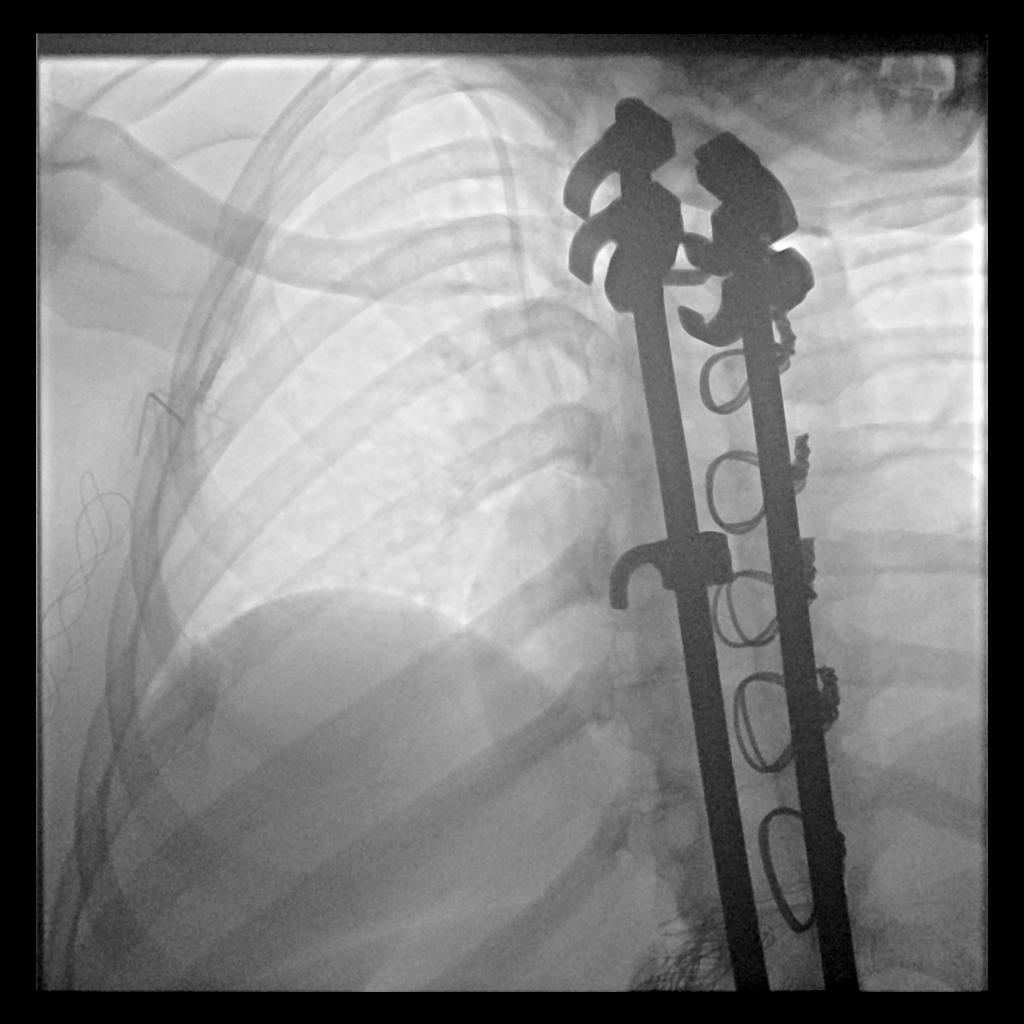

[2 of 2 positions shown; findings below may reference images not displayed]

MEDICATIONS:
Moderate sedation

ANESTHESIA/SEDATION:
Versed 4.0 mg IV; Fentanyl 150 mcg IV;

Moderate Sedation Time:  50 minutes

The patient was continuously monitored during the procedure by the
interventional radiology nurse under my direct supervision.

FLUOROSCOPY TIME:  36 seconds, 25.9 mGy

COMPLICATIONS:
None immediate.

PROCEDURE:
The procedure, risks, benefits, and alternatives were explained to
the patient. Questions regarding the procedure were encouraged and
answered. The patient understands and consents to the procedure.

Patient was placed supine on the interventional table. Ultrasound
confirmed a patent right internal jugular vein. Ultrasound image was
saved for documentation. The right chest and neck were cleaned with
a skin antiseptic and a sterile drape was placed. Maximal barrier
sterile technique was utilized including caps, mask, sterile gowns,
sterile gloves, sterile drape, hand hygiene and skin antiseptic. The
right neck was anesthetized with 1% lidocaine. Small incision was
made in the right neck with a blade. Micropuncture set was placed in
the right internal jugular vein with ultrasound guidance. The
micropuncture wire was used for measurement purposes. The right
chest was anesthetized with 1% lidocaine with epinephrine. #15 blade
was used to make an incision and a subcutaneous port pocket was
formed. 8 french Power Port was assembled. Subcutaneous tunnel was
formed with a stiff tunneling device. The port catheter was brought
through the subcutaneous tunnel. The port was placed in the
subcutaneous pocket and secured in place with 3-0 Ethilon sutures.
The micropuncture set was exchanged for a peel-away sheath. The
catheter was placed through the peel-away sheath and the tip was
positioned at the superior cavoatrial junction. Catheter placement
was confirmed with fluoroscopy. The port was accessed and flushed
with heparinized saline. The port pocket was closed using two layers
of absorbable sutures and Dermabond. The vein skin site was closed
using a single layer of absorbable suture and Dermabond. Sterile
dressings were applied. Patient tolerated the procedure well without
an immediate complication. Ultrasound and fluoroscopic images were
taken and saved for this procedure.
IMPRESSION: Placement of a subcutaneous port device. Catheter tip at the
superior cavoatrial junction.
